# Patient Record
Sex: Female | Born: 1950 | Race: White | Hispanic: No | Smoking: Never smoker
Health system: Southern US, Community
[De-identification: ages and names within clinical notes are randomized; demographics above are authoritative.]

## PROBLEM LIST (undated history)

## (undated) DIAGNOSIS — H269 Unspecified cataract: Secondary | ICD-10-CM

## (undated) DIAGNOSIS — M2540 Effusion, unspecified joint: Secondary | ICD-10-CM

## (undated) DIAGNOSIS — R112 Nausea with vomiting, unspecified: Secondary | ICD-10-CM

## (undated) DIAGNOSIS — R42 Dizziness and giddiness: Secondary | ICD-10-CM

## (undated) DIAGNOSIS — E119 Type 2 diabetes mellitus without complications: Secondary | ICD-10-CM

## (undated) DIAGNOSIS — F419 Anxiety disorder, unspecified: Secondary | ICD-10-CM

## (undated) DIAGNOSIS — Z9889 Other specified postprocedural states: Secondary | ICD-10-CM

## (undated) DIAGNOSIS — R609 Edema, unspecified: Secondary | ICD-10-CM

## (undated) DIAGNOSIS — E063 Autoimmune thyroiditis: Secondary | ICD-10-CM

## (undated) DIAGNOSIS — K200 Eosinophilic esophagitis: Secondary | ICD-10-CM

## (undated) DIAGNOSIS — E278 Other specified disorders of adrenal gland: Secondary | ICD-10-CM

## (undated) DIAGNOSIS — D179 Benign lipomatous neoplasm, unspecified: Secondary | ICD-10-CM

## (undated) DIAGNOSIS — R0600 Dyspnea, unspecified: Secondary | ICD-10-CM

## (undated) DIAGNOSIS — I10 Essential (primary) hypertension: Secondary | ICD-10-CM

## (undated) DIAGNOSIS — M1990 Unspecified osteoarthritis, unspecified site: Secondary | ICD-10-CM

## (undated) DIAGNOSIS — K219 Gastro-esophageal reflux disease without esophagitis: Secondary | ICD-10-CM

## (undated) DIAGNOSIS — R197 Diarrhea, unspecified: Secondary | ICD-10-CM

## (undated) DIAGNOSIS — D649 Anemia, unspecified: Secondary | ICD-10-CM

## (undated) DIAGNOSIS — M62830 Muscle spasm of back: Secondary | ICD-10-CM

## (undated) DIAGNOSIS — E079 Disorder of thyroid, unspecified: Secondary | ICD-10-CM

## (undated) DIAGNOSIS — J189 Pneumonia, unspecified organism: Secondary | ICD-10-CM

## (undated) DIAGNOSIS — E785 Hyperlipidemia, unspecified: Secondary | ICD-10-CM

## (undated) DIAGNOSIS — R350 Frequency of micturition: Secondary | ICD-10-CM

## (undated) DIAGNOSIS — Z86010 Personal history of colonic polyps: Secondary | ICD-10-CM

## (undated) DIAGNOSIS — F329 Major depressive disorder, single episode, unspecified: Secondary | ICD-10-CM

## (undated) DIAGNOSIS — R51 Headache: Secondary | ICD-10-CM

## (undated) DIAGNOSIS — M2550 Pain in unspecified joint: Secondary | ICD-10-CM

## (undated) DIAGNOSIS — I499 Cardiac arrhythmia, unspecified: Secondary | ICD-10-CM

## (undated) DIAGNOSIS — F32A Depression, unspecified: Secondary | ICD-10-CM

## (undated) HISTORY — PX: OTHER SURGICAL HISTORY: SHX169

## (undated) HISTORY — PX: BIOPSY THYROID: PRO38

## (undated) HISTORY — PX: ESOPHAGOGASTRODUODENOSCOPY: SHX1529

## (undated) HISTORY — PX: VAGINAL HYSTERECTOMY: SUR661

## (undated) HISTORY — DX: Hyperlipidemia, unspecified: E78.5

## (undated) HISTORY — PX: WISDOM TOOTH EXTRACTION: SHX21

## (undated) HISTORY — PX: DENTAL SURGERY: SHX609

## (undated) HISTORY — DX: Gastro-esophageal reflux disease without esophagitis: K21.9

## (undated) HISTORY — PX: COLONOSCOPY: SHX174

## (undated) HISTORY — DX: Disorder of thyroid, unspecified: E07.9

## (undated) HISTORY — PX: CARDIAC CATHETERIZATION: SHX172

## (undated) HISTORY — PX: DIAGNOSTIC LAPAROSCOPY: SUR761

## (undated) HISTORY — DX: Essential (primary) hypertension: I10

## (undated) HISTORY — DX: Anemia, unspecified: D64.9

## (undated) HISTORY — DX: Unspecified osteoarthritis, unspecified site: M19.90

---
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
## 1978-07-21 HISTORY — PX: LAPAROSCOPIC ENDOMETRIOSIS FULGURATION: SUR769

## 1986-07-21 HISTORY — PX: OTHER SURGICAL HISTORY: SHX169

## 1998-07-16 ENCOUNTER — Other Ambulatory Visit: Admission: RE | Admit: 1998-07-16 | Discharge: 1998-07-16 | Payer: Self-pay | Admitting: Obstetrics and Gynecology

## 1999-03-25 ENCOUNTER — Emergency Department (HOSPITAL_COMMUNITY): Admission: EM | Admit: 1999-03-25 | Discharge: 1999-03-25 | Payer: Self-pay | Admitting: Emergency Medicine

## 1999-08-02 ENCOUNTER — Other Ambulatory Visit: Admission: RE | Admit: 1999-08-02 | Discharge: 1999-08-02 | Payer: Self-pay | Admitting: Obstetrics and Gynecology

## 2001-02-22 ENCOUNTER — Other Ambulatory Visit: Admission: RE | Admit: 2001-02-22 | Discharge: 2001-02-22 | Payer: Self-pay | Admitting: *Deleted

## 2001-09-19 ENCOUNTER — Emergency Department (HOSPITAL_COMMUNITY): Admission: EM | Admit: 2001-09-19 | Discharge: 2001-09-19 | Payer: Self-pay | Admitting: Emergency Medicine

## 2002-06-23 ENCOUNTER — Other Ambulatory Visit: Admission: RE | Admit: 2002-06-23 | Discharge: 2002-06-23 | Payer: Self-pay | Admitting: *Deleted

## 2006-12-29 ENCOUNTER — Encounter: Admission: RE | Admit: 2006-12-29 | Discharge: 2006-12-29 | Payer: Self-pay | Admitting: Internal Medicine

## 2007-01-01 ENCOUNTER — Encounter: Admission: RE | Admit: 2007-01-01 | Discharge: 2007-01-01 | Payer: Self-pay | Admitting: Internal Medicine

## 2007-01-26 ENCOUNTER — Encounter: Admission: RE | Admit: 2007-01-26 | Discharge: 2007-01-26 | Payer: Self-pay | Admitting: Internal Medicine

## 2007-01-26 ENCOUNTER — Encounter (INDEPENDENT_AMBULATORY_CARE_PROVIDER_SITE_OTHER): Payer: Self-pay | Admitting: Interventional Radiology

## 2007-01-26 ENCOUNTER — Other Ambulatory Visit: Admission: RE | Admit: 2007-01-26 | Discharge: 2007-01-26 | Payer: Self-pay | Admitting: Interventional Radiology

## 2007-06-01 ENCOUNTER — Ambulatory Visit (HOSPITAL_COMMUNITY): Admission: RE | Admit: 2007-06-01 | Discharge: 2007-06-01 | Payer: Self-pay | Admitting: Urology

## 2008-02-21 ENCOUNTER — Encounter: Admission: RE | Admit: 2008-02-21 | Discharge: 2008-03-14 | Payer: Self-pay | Admitting: Internal Medicine

## 2008-10-19 ENCOUNTER — Encounter: Admission: RE | Admit: 2008-10-19 | Discharge: 2008-10-19 | Payer: Self-pay | Admitting: Internal Medicine

## 2009-06-05 ENCOUNTER — Encounter: Admission: RE | Admit: 2009-06-05 | Discharge: 2009-06-05 | Payer: Self-pay | Admitting: Internal Medicine

## 2010-08-10 ENCOUNTER — Encounter: Payer: Self-pay | Admitting: Internal Medicine

## 2010-09-30 ENCOUNTER — Ambulatory Visit
Admission: RE | Admit: 2010-09-30 | Discharge: 2010-09-30 | Disposition: A | Discharge status: Home / Self Care | Payer: 59 | Source: Ambulatory Visit | Attending: Internal Medicine | Admitting: Internal Medicine

## 2010-09-30 ENCOUNTER — Other Ambulatory Visit: Payer: Self-pay | Admitting: Internal Medicine

## 2010-09-30 DIAGNOSIS — R110 Nausea: Secondary | ICD-10-CM

## 2010-09-30 DIAGNOSIS — R1011 Right upper quadrant pain: Secondary | ICD-10-CM

## 2011-03-12 ENCOUNTER — Other Ambulatory Visit: Payer: Self-pay | Admitting: Internal Medicine

## 2011-03-13 ENCOUNTER — Ambulatory Visit
Admission: RE | Admit: 2011-03-13 | Discharge: 2011-03-13 | Disposition: A | Discharge status: Home / Self Care | Payer: 59 | Source: Ambulatory Visit | Attending: Internal Medicine | Admitting: Internal Medicine

## 2013-08-02 ENCOUNTER — Other Ambulatory Visit: Payer: Self-pay | Admitting: Gastroenterology

## 2013-08-02 DIAGNOSIS — R110 Nausea: Secondary | ICD-10-CM

## 2013-08-17 ENCOUNTER — Ambulatory Visit (HOSPITAL_COMMUNITY): Payer: 59

## 2013-08-26 ENCOUNTER — Encounter (HOSPITAL_COMMUNITY)
Admission: RE | Admit: 2013-08-26 | Discharge: 2013-08-26 | Disposition: A | Discharge status: Home / Self Care | Payer: 59 | Source: Ambulatory Visit | Attending: Gastroenterology | Admitting: Gastroenterology

## 2013-08-26 ENCOUNTER — Ambulatory Visit (HOSPITAL_COMMUNITY)
Admission: RE | Admit: 2013-08-26 | Discharge: 2013-08-26 | Disposition: A | Discharge status: Home / Self Care | Payer: 59 | Source: Ambulatory Visit | Attending: Gastroenterology | Admitting: Gastroenterology

## 2013-08-26 DIAGNOSIS — C787 Secondary malignant neoplasm of liver and intrahepatic bile duct: Secondary | ICD-10-CM | POA: Insufficient documentation

## 2013-08-26 DIAGNOSIS — R110 Nausea: Secondary | ICD-10-CM

## 2013-08-26 DIAGNOSIS — C801 Malignant (primary) neoplasm, unspecified: Secondary | ICD-10-CM | POA: Insufficient documentation

## 2013-08-26 MED ORDER — SINCALIDE 5 MCG IJ SOLR
0.0200 ug/kg | Freq: Once | INTRAMUSCULAR | Status: AC
  Administered 2013-08-26: 1.82 ug via INTRAVENOUS

## 2013-08-26 MED ORDER — TECHNETIUM TC 99M MEBROFENIN IV KIT
5.0000 | PACK | Freq: Once | INTRAVENOUS | Status: AC | PRN
  Administered 2013-08-26: 5 via INTRAVENOUS

## 2013-08-26 MED ORDER — SINCALIDE 5 MCG IJ SOLR
INTRAMUSCULAR | Status: AC
  Administered 2013-08-26: 1.82 ug via INTRAVENOUS
  Filled 2013-08-26: qty 5

## 2013-08-26 MED ORDER — STERILE WATER FOR INJECTION IJ SOLN
INTRAMUSCULAR | Status: AC
  Filled 2013-08-26: qty 10

## 2013-09-13 ENCOUNTER — Ambulatory Visit (INDEPENDENT_AMBULATORY_CARE_PROVIDER_SITE_OTHER): Payer: 59 | Admitting: General Surgery

## 2013-10-04 ENCOUNTER — Encounter (INDEPENDENT_AMBULATORY_CARE_PROVIDER_SITE_OTHER): Payer: Self-pay | Admitting: General Surgery

## 2013-10-04 ENCOUNTER — Encounter (INDEPENDENT_AMBULATORY_CARE_PROVIDER_SITE_OTHER): Payer: Self-pay

## 2013-10-04 ENCOUNTER — Ambulatory Visit (INDEPENDENT_AMBULATORY_CARE_PROVIDER_SITE_OTHER): Payer: 59 | Admitting: General Surgery

## 2013-10-04 VITALS — BP 130/74 | HR 78 | Temp 97.7°F | Ht 62.0 in | Wt 199.0 lb

## 2013-10-04 DIAGNOSIS — K828 Other specified diseases of gallbladder: Secondary | ICD-10-CM

## 2013-10-04 NOTE — Progress Notes (Signed)
Patient ID: Sarah Fuentes, female   DOB: March 22, 1951, 63 y.o.   MRN: [id number]  Chief Complaint  Patient presents with  . Abdominal Pain    new pt    HPI Sarah Fuentes is a 63 y.o. female.  Here for evaluation of biliary dyskinesia.  HPI The patient has had 2 years of abdominal discomfort. A lot of times as follows eating fatty foods and very spicy foods. She had an upper GI endoscopy and a colonoscopy which were negative. An ultrasound of the abdomen demonstrated no evidence of gallstones or acute cholecystitis. The patient had a HIDA scan which demonstrated an ejection fraction of less than 9%. A surgical consultation was obtained. Past Medical History  Diagnosis Date  . Anemia   . Arthritis   . GERD (gastroesophageal reflux disease)   . Hyperlipidemia   . Hypertension   . Chronic kidney disease   . Thyroid disease     Past Surgical History  Procedure Laterality Date  . Tonsillectomy and adenoidectomy  1955  . Abdominal hysterectomy    . Laparoscopic endometriosis fulguration  1980    Family History  Problem Relation Age of Onset  . Cancer Mother   . Kidney disease Father     Social History History  Substance Use Topics  . Smoking status: Never Smoker   . Smokeless tobacco: Not on file  . Alcohol Use: No    Allergies  Allergen Reactions  . Iohexol      Code: HIVES, Desc: Pt states years ago she had several IVP's and became allergic by breaking out in hives.  She since has had benadryl prior to "new" contrast has  done ok.     Current Outpatient Prescriptions  Medication Sig Dispense Refill  . ALPRAZolam (XANAX) 0.25 MG tablet Take 0.25 mg by mouth at bedtime as needed for anxiety.      Marland Kitchen BYSTOLIC 10 MG tablet       . Dexlansoprazole (DEXILANT PO) Take 60 mg by mouth.      Cristy Friedlander HFA 110 MCG/ACT inhaler       . furosemide (LASIX) 40 MG tablet       . valsartan (DIOVAN) 320 MG tablet        No current facility-administered medications for this visit.     Review of Systems Review of Systems  Constitutional: Positive for appetite change.  HENT: Negative.   Respiratory: Negative.   Cardiovascular: Negative for chest pain.  Gastrointestinal: Positive for nausea and abdominal pain.  Endocrine: Negative.   Genitourinary: Negative.   Allergic/Immunologic: Negative.     Blood pressure 130/74, pulse 78, temperature 97.7 F (36.5 C), temperature source Oral, height 5\' 2"  (1.575 m), weight 199 lb (90.266 kg).  Physical Exam Physical Exam  Constitutional: She is oriented to person, place, and time. She appears well-developed and well-nourished.  HENT:  Head: Normocephalic and atraumatic.  Eyes: Conjunctivae and EOM are normal. Pupils are equal, round, and reactive to light.  Neck: Normal range of motion. Neck supple.  Cardiovascular: Normal rate, regular rhythm and normal heart sounds.   Pulmonary/Chest: Effort normal and breath sounds normal.  Abdominal: Soft. Normal appearance and bowel sounds are normal. There is tenderness (mild) in the right upper quadrant and epigastric area.  Genitourinary: Vagina normal and uterus normal.  Musculoskeletal: Normal range of motion.  Neurological: She is alert and oriented to person, place, and time. She has normal reflexes.  Skin: Skin is warm and dry.  Psychiatric: She has  a normal mood and affect. Her behavior is normal. Judgment and thought content normal.    Data Reviewed I have reviewed the study sent over by her primary care physician and her gastroenterologist.  Assessment     The diagnosis of biliary dyskinesia is supported.  Symptomatic biliary dyskinesia however the patient did not respond to the cholecystokinin injection as you normally expect. Based on her history of postprandial pain especially with fatty foods I suspect the patient does have symptoms related to her biliary dyskinesia.     Plan    Laparoscopic cholecystectomy. The patient has multiple allergies. One of which  is likely iodine. I would not perform a cholangiogram for this patient.        Gwenyth Ober 10/04/2013, 10:05 AM

## 2013-10-14 ENCOUNTER — Encounter (INDEPENDENT_AMBULATORY_CARE_PROVIDER_SITE_OTHER): Payer: Self-pay

## 2013-10-17 ENCOUNTER — Encounter (HOSPITAL_COMMUNITY): Payer: Self-pay

## 2013-10-24 NOTE — Pre-Procedure Instructions (Signed)
MEYGAN KYSER  10/24/2013   Your procedure is scheduled on:  Tues, April 14 @ 7:15 AM  Report to [facility]  at 5:30 AM.  Call this number if you have problems the morning of surgery: [phone]   Remember:   Do not eat food or drink liquids after midnight.   Take these medicines the morning of surgery with A SIP OF WATER: Xanax(Alprazolam),Bystolic,Dexilant(Dexlansoprazole),and Flovent<Bring Your Inhaler With You>              Stop taking your Ibuprofen.No Goody's,BC's,Aleve,Aspirin,Fish Oil,or any Herbal Medications   Do not wear jewelry, make-up or nail polish.  Do not wear lotions, powders, or perfumes. You may wear deodorant.  Do not shave 48 hours prior to surgery.   Do not bring valuables to the hospital.  [facility] is not responsible                  for any belongings or valuables.               Contacts, dentures or bridgework may not be worn into surgery.  Leave suitcase in the car. After surgery it may be brought to your room.  For patients admitted to the hospital, discharge time is determined by your                treatment team.               Patients discharged the day of surgery will not be allowed to drive  home.    Special Instructions:  [place] - Preparing for Surgery  Before surgery, you can play an important role.  Because skin is not sterile, your skin needs to be as free of germs as possible.  You can reduce the number of germs on you skin by washing with CHG (chlorahexidine gluconate) soap before surgery.  CHG is an antiseptic cleaner which kills germs and bonds with the skin to continue killing germs even after washing.  Please DO NOT use if you have an allergy to CHG or antibacterial soaps.  If your skin becomes reddened/irritated stop using the CHG and inform your nurse when you arrive at Short Stay.  Do not shave (including legs and underarms) for at least 48 hours prior to the first CHG shower.  You may shave your face.  Please follow  these instructions carefully:   1.  Shower with CHG Soap the night before surgery and the                                morning of Surgery.  2.  If you choose to wash your hair, wash your hair first as usual with your       normal shampoo.  3.  After you shampoo, rinse your hair and body thoroughly to remove the                      Shampoo.  4.  Use CHG as you would any other liquid soap.  You can apply chg directly       to the skin and wash gently with scrungie or a clean washcloth.  5.  Apply the CHG Soap to your body ONLY FROM THE NECK DOWN.        Do not use on open wounds or open sores.  Avoid contact with your eyes,  ears, mouth and genitals (private parts).  Wash genitals (private parts)       with your normal soap.  6.  Wash thoroughly, paying special attention to the area where your surgery        will be performed.  7.  Thoroughly rinse your body with warm water from the neck down.  8.  DO NOT shower/wash with your normal soap after using and rinsing off       the CHG Soap.  9.  Pat yourself dry with a clean towel.            10.  Wear clean pajamas.            11.  Place clean sheets on your bed the night of your first shower and do not        sleep with pets.  Day of Surgery  Do not apply any lotions/deoderants the morning of surgery.  Please wear clean clothes to the hospital/surgery center.     Please read over the following fact sheets that you were given: Pain Booklet, Coughing and Deep Breathing and Surgical Site Infection Prevention

## 2013-10-25 ENCOUNTER — Encounter (HOSPITAL_COMMUNITY)
Admission: RE | Admit: 2013-10-25 | Discharge: 2013-10-25 | Disposition: A | Discharge status: Home / Self Care | Payer: 59 | Source: Ambulatory Visit | Attending: General Surgery | Admitting: General Surgery

## 2013-10-25 ENCOUNTER — Encounter (HOSPITAL_COMMUNITY): Payer: Self-pay

## 2013-10-25 DIAGNOSIS — Z01812 Encounter for preprocedural laboratory examination: Secondary | ICD-10-CM | POA: Insufficient documentation

## 2013-10-25 DIAGNOSIS — Z01810 Encounter for preprocedural cardiovascular examination: Secondary | ICD-10-CM | POA: Insufficient documentation

## 2013-10-25 DIAGNOSIS — Z01818 Encounter for other preprocedural examination: Secondary | ICD-10-CM | POA: Insufficient documentation

## 2013-10-25 HISTORY — DX: Benign lipomatous neoplasm, unspecified: D17.9

## 2013-10-25 HISTORY — DX: Cardiac arrhythmia, unspecified: I49.9

## 2013-10-25 HISTORY — DX: Depression, unspecified: F32.A

## 2013-10-25 HISTORY — DX: Effusion, unspecified joint: M25.40

## 2013-10-25 HISTORY — DX: Other specified disorders of adrenal gland: E27.8

## 2013-10-25 HISTORY — DX: Dizziness and giddiness: R42

## 2013-10-25 HISTORY — DX: Diarrhea, unspecified: R19.7

## 2013-10-25 HISTORY — DX: Frequency of micturition: R35.0

## 2013-10-25 HISTORY — DX: Eosinophilic esophagitis: K20.0

## 2013-10-25 HISTORY — DX: Type 2 diabetes mellitus without complications: E11.9

## 2013-10-25 HISTORY — DX: Edema, unspecified: R60.9

## 2013-10-25 HISTORY — DX: Nausea with vomiting, unspecified: R11.2

## 2013-10-25 HISTORY — DX: Anxiety disorder, unspecified: F41.9

## 2013-10-25 HISTORY — DX: Pain in unspecified joint: M25.50

## 2013-10-25 HISTORY — DX: Major depressive disorder, single episode, unspecified: F32.9

## 2013-10-25 HISTORY — DX: Autoimmune thyroiditis: E06.3

## 2013-10-25 HISTORY — DX: Unspecified cataract: H26.9

## 2013-10-25 HISTORY — DX: Other specified postprocedural states: Z98.890

## 2013-10-25 HISTORY — DX: Personal history of colonic polyps: Z86.010

## 2013-10-25 HISTORY — DX: Muscle spasm of back: M62.830

## 2013-10-25 HISTORY — DX: Headache: R51

## 2013-10-25 LAB — CBC WITH DIFFERENTIAL/PLATELET
BASOS PCT: 0 % (ref 0–1)
Basophils Absolute: 0 10*3/uL (ref 0.0–0.1)
Eosinophils Absolute: 0.3 10*3/uL (ref 0.0–0.7)
Eosinophils Relative: 2 % (ref 0–5)
HCT: 37.9 % (ref 36.0–46.0)
HEMOGLOBIN: 12.7 g/dL (ref 12.0–15.0)
LYMPHS PCT: 28 % (ref 12–46)
Lymphs Abs: 2.9 10*3/uL (ref 0.7–4.0)
MCH: 29.9 pg (ref 26.0–34.0)
MCHC: 33.5 g/dL (ref 30.0–36.0)
MCV: 89.2 fL (ref 78.0–100.0)
MONOS PCT: 3 % (ref 3–12)
Monocytes Absolute: 0.3 10*3/uL (ref 0.1–1.0)
NEUTROS PCT: 67 % (ref 43–77)
Neutro Abs: 6.8 10*3/uL (ref 1.7–7.7)
Platelets: 310 10*3/uL (ref 150–400)
RBC: 4.25 MIL/uL (ref 3.87–5.11)
RDW: 13.4 % (ref 11.5–15.5)
WBC: 10.4 10*3/uL (ref 4.0–10.5)

## 2013-10-25 LAB — COMPREHENSIVE METABOLIC PANEL
ALBUMIN: 3.8 g/dL (ref 3.5–5.2)
ALT: 19 U/L (ref 0–35)
AST: 17 U/L (ref 0–37)
Alkaline Phosphatase: 86 U/L (ref 39–117)
BILIRUBIN TOTAL: 0.2 mg/dL — AB (ref 0.3–1.2)
BUN: 17 mg/dL (ref 6–23)
CHLORIDE: 102 meq/L (ref 96–112)
CO2: 26 mEq/L (ref 19–32)
Calcium: 9.7 mg/dL (ref 8.4–10.5)
Creatinine, Ser: 0.54 mg/dL (ref 0.50–1.10)
GFR calc Af Amer: >90 mL/min (ref >90)
GFR calc non Af Amer: >90 mL/min (ref >90)
Glucose, Bld: 125 mg/dL — ABNORMAL HIGH (ref 70–99)
Potassium: 4.2 mEq/L (ref 3.7–5.3)
Sodium: 142 mEq/L (ref 137–147)
Total Protein: 7.3 g/dL (ref 6.0–8.3)

## 2013-10-25 NOTE — Progress Notes (Addendum)
  Pt doesn't have a cardiologist  Stress test done > 28yrs ago  Echo done > 59yrs ago  Heart cath in the late 90's  Denies EKG and CXR in past yr   Medical Md is Dr.James Maudie Mercury

## 2013-10-26 NOTE — Progress Notes (Addendum)
Anesthesia Chart Review:  Patient is a 62 year old female scheduled for laparoscopic cholecystectomy on 4/141/5 by Dr. Hulen Skains.  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 BMI is 39 consistent with obesity. She has multiple allergies as outlined in Epic. PCP is Dr. Jani Gravel.  She does not see a cardiologist.  Stress/Echo were over 5 years ago.  Cardiac cath in the 1990's.  Preoperative CXR and labs noted.    EKG on 10/25/13 showed NSR, left BBB. She reported known history of left BBB.  Records requested from Dr. Maudie Mercury.  I will review once available.  George [facility] Short Stay Center/Anesthesiology Phone [phone] 10/26/2013 1:08 PM  Addendum: 10/27/2013 2:50 PM Records from Dr. Maudie Mercury are still pending.  I called and spoke with patient.  She states a left BBB was found on a pre-operative EKG in the early 80's.  She did not undergo a cardiac work-up then but has since had a cardiac cath by Dr. Glade Lloyd at [facility] ~ 1998 that was reportedly normal.  She also says she had a stress test ~ 3-5 years ago.  I called the former [place] who reported that Dr.  Maudie Mercury had referred her there for a stress test in 2010, but that her chart was now in storage. Patient denies chest pain, SOB, pre-syncope, palpitations, or significant LE edema. She lives on the second floor of a condo and is able to climb stairs and do her own housework without CV symptoms.  In regards to her Hashimoto's thyroiditis, she reports her thyroid hormone levels have been normal, so she is being monitored for now. Will follow-up tomorrow if still no records from Dr. Maudie Mercury.  Addendum: 10/28/2013 12:45 PM Received records from [facility].  EKG on 04/10/10 showed ST @ 101 bpm, LAD, left BBB.   Nuclear stress test on 05/21/09 showed baseline resting ECG left bundle branch block. Normal myocardial perfusion scan demonstrating an attenuation artifact in the anterior region of the myocardium. No ischemia or infarct/scar is seen in the remaining myocardium. This is a low risk scan. Study was technically difficult due to severe breast and diaphragmatic attenuation. Large amount of gut activity present. There is no distinct area of ischemia present.  Echocardiogram on 03/28/08 showed estimated LVEF 55-65%, mild tricuspid insufficiency, mildly elevated pulmonary artery pressure, flat mitral valve closure, nondiagnostic for mitral valve prolapse, mild mitral annular calcification with trace mitral regurgitation.  Patient with previous non-ischemic stress test in 2010.  She had known left BBB at that time.  She is asymptomatic from a CV standpoint and is able to climb stairs up to her condo without CV symptoms.  If no acute changes then I anticipate that she can proceed as planned.  Anesthesiologist Dr. Linna Caprice agrees with this plan.

## 2013-10-31 MED ORDER — CHLORHEXIDINE GLUCONATE 4 % EX LIQD
1.0000 "application " | Freq: Once | CUTANEOUS | Status: DC
  Filled 2013-10-31: qty 15

## 2013-11-01 ENCOUNTER — Encounter (HOSPITAL_COMMUNITY): Payer: Self-pay | Admitting: Anesthesiology

## 2013-11-01 ENCOUNTER — Encounter (HOSPITAL_COMMUNITY)
Admission: RE | Disposition: A | Discharge status: Home / Self Care | Payer: Self-pay | Source: Ambulatory Visit | Attending: General Surgery

## 2013-11-01 ENCOUNTER — Ambulatory Visit (HOSPITAL_COMMUNITY)
Admission: RE | Admit: 2013-11-01 | Discharge: 2013-11-01 | Disposition: A | Discharge status: Home / Self Care | Payer: 59 | Source: Ambulatory Visit | Attending: General Surgery | Admitting: General Surgery

## 2013-11-01 ENCOUNTER — Ambulatory Visit (HOSPITAL_COMMUNITY): Payer: 59 | Admitting: Anesthesiology

## 2013-11-01 ENCOUNTER — Encounter (HOSPITAL_COMMUNITY): Payer: 59 | Admitting: Vascular Surgery

## 2013-11-01 DIAGNOSIS — E785 Hyperlipidemia, unspecified: Secondary | ICD-10-CM | POA: Insufficient documentation

## 2013-11-01 DIAGNOSIS — N189 Chronic kidney disease, unspecified: Secondary | ICD-10-CM | POA: Insufficient documentation

## 2013-11-01 DIAGNOSIS — K811 Chronic cholecystitis: Secondary | ICD-10-CM | POA: Insufficient documentation

## 2013-11-01 DIAGNOSIS — K219 Gastro-esophageal reflux disease without esophagitis: Secondary | ICD-10-CM | POA: Insufficient documentation

## 2013-11-01 DIAGNOSIS — I129 Hypertensive chronic kidney disease with stage 1 through stage 4 chronic kidney disease, or unspecified chronic kidney disease: Secondary | ICD-10-CM | POA: Insufficient documentation

## 2013-11-01 DIAGNOSIS — K828 Other specified diseases of gallbladder: Secondary | ICD-10-CM

## 2013-11-01 HISTORY — PX: CHOLECYSTECTOMY: SHX55

## 2013-11-01 LAB — GLUCOSE, CAPILLARY
GLUCOSE-CAPILLARY: 112 mg/dL — AB (ref 70–99)
Glucose-Capillary: 145 mg/dL — ABNORMAL HIGH (ref 70–99)

## 2013-11-01 SURGERY — LAPAROSCOPIC CHOLECYSTECTOMY
Anesthesia: General | Site: Abdomen

## 2013-11-01 MED ORDER — ROCURONIUM BROMIDE 50 MG/5ML IV SOLN
INTRAVENOUS | Status: AC
  Filled 2013-11-01: qty 1

## 2013-11-01 MED ORDER — BUPIVACAINE-EPINEPHRINE 0.25% -1:200000 IJ SOLN
INTRAMUSCULAR | Status: DC | PRN
  Administered 2013-11-01: 30 mL

## 2013-11-01 MED ORDER — EPHEDRINE SULFATE 50 MG/ML IJ SOLN
INTRAMUSCULAR | Status: AC
  Filled 2013-11-01: qty 1

## 2013-11-01 MED ORDER — HYDROCODONE-ACETAMINOPHEN 5-325 MG PO TABS: 1.0000 | ORAL_TABLET | ORAL | Status: DC | PRN

## 2013-11-01 MED ORDER — METOCLOPRAMIDE HCL 5 MG/ML IJ SOLN
INTRAMUSCULAR | Status: AC
  Filled 2013-11-01: qty 2

## 2013-11-01 MED ORDER — SODIUM CHLORIDE 0.9 % IR SOLN
Status: DC | PRN
  Administered 2013-11-01: 1000 mL

## 2013-11-01 MED ORDER — PROMETHAZINE HCL 12.5 MG PO TABS: 12.5000 mg | ORAL_TABLET | Freq: Four times a day (QID) | ORAL | Status: AC | PRN

## 2013-11-01 MED ORDER — OXYCODONE HCL 5 MG/5ML PO SOLN: 5.0000 mg | Freq: Once | ORAL | Status: DC | PRN

## 2013-11-01 MED ORDER — METOCLOPRAMIDE HCL 5 MG/ML IJ SOLN
10.0000 mg | Freq: Once | INTRAMUSCULAR | Status: AC | PRN
  Administered 2013-11-01: 10 mg via INTRAVENOUS

## 2013-11-01 MED ORDER — SODIUM CHLORIDE 0.9 % IJ SOLN
INTRAMUSCULAR | Status: AC
  Filled 2013-11-01: qty 10

## 2013-11-01 MED ORDER — PROMETHAZINE HCL 25 MG/ML IJ SOLN
INTRAMUSCULAR | Status: AC
  Administered 2013-11-01: 6.25 mg
  Filled 2013-11-01: qty 1

## 2013-11-01 MED ORDER — DEXAMETHASONE SODIUM PHOSPHATE 10 MG/ML IJ SOLN
INTRAMUSCULAR | Status: DC | PRN
  Administered 2013-11-01: 10 mg via INTRAVENOUS

## 2013-11-01 MED ORDER — MIDAZOLAM HCL 5 MG/5ML IJ SOLN
INTRAMUSCULAR | Status: DC | PRN
  Administered 2013-11-01: 2 mg via INTRAVENOUS

## 2013-11-01 MED ORDER — EPHEDRINE SULFATE 50 MG/ML IJ SOLN
INTRAMUSCULAR | Status: DC | PRN
  Administered 2013-11-01: 15 mg via INTRAVENOUS

## 2013-11-01 MED ORDER — ONDANSETRON HCL 4 MG/2ML IJ SOLN
INTRAMUSCULAR | Status: DC | PRN
  Administered 2013-11-01: 4 mg via INTRAVENOUS

## 2013-11-01 MED ORDER — PROPOFOL 10 MG/ML IV BOLUS
INTRAVENOUS | Status: DC | PRN
  Administered 2013-11-01: 200 mg via INTRAVENOUS

## 2013-11-01 MED ORDER — FENTANYL CITRATE 0.05 MG/ML IJ SOLN
INTRAMUSCULAR | Status: AC
  Filled 2013-11-01: qty 5

## 2013-11-01 MED ORDER — HYDROMORPHONE HCL PF 1 MG/ML IJ SOLN
0.2500 mg | INTRAMUSCULAR | Status: DC | PRN
  Administered 2013-11-01 (×4): 0.25 mg via INTRAVENOUS

## 2013-11-01 MED ORDER — CIPROFLOXACIN IN D5W 400 MG/200ML IV SOLN
INTRAVENOUS | Status: AC
  Administered 2013-11-01: 400 mg via INTRAVENOUS
  Filled 2013-11-01: qty 200

## 2013-11-01 MED ORDER — LIDOCAINE HCL (CARDIAC) 20 MG/ML IV SOLN
INTRAVENOUS | Status: AC
  Filled 2013-11-01: qty 5

## 2013-11-01 MED ORDER — METOCLOPRAMIDE HCL 5 MG/ML IJ SOLN
INTRAMUSCULAR | Status: DC | PRN
  Administered 2013-11-01: 10 mg via INTRAVENOUS

## 2013-11-01 MED ORDER — GLYCOPYRROLATE 0.2 MG/ML IJ SOLN
INTRAMUSCULAR | Status: AC
  Filled 2013-11-01: qty 4

## 2013-11-01 MED ORDER — LACTATED RINGERS IV SOLN
INTRAVENOUS | Status: DC | PRN
  Administered 2013-11-01 (×2): via INTRAVENOUS

## 2013-11-01 MED ORDER — DEXAMETHASONE SODIUM PHOSPHATE 10 MG/ML IJ SOLN
INTRAMUSCULAR | Status: AC
  Filled 2013-11-01: qty 1

## 2013-11-01 MED ORDER — FENTANYL CITRATE 0.05 MG/ML IJ SOLN
INTRAMUSCULAR | Status: DC | PRN
  Administered 2013-11-01: 50 ug via INTRAVENOUS
  Administered 2013-11-01: 100 ug via INTRAVENOUS

## 2013-11-01 MED ORDER — ROCURONIUM BROMIDE 100 MG/10ML IV SOLN
INTRAVENOUS | Status: DC | PRN
  Administered 2013-11-01: 40 mg via INTRAVENOUS

## 2013-11-01 MED ORDER — NEOSTIGMINE METHYLSULFATE 1 MG/ML IJ SOLN
INTRAMUSCULAR | Status: DC | PRN
  Administered 2013-11-01: 4 mg via INTRAVENOUS

## 2013-11-01 MED ORDER — NEOSTIGMINE METHYLSULFATE 1 MG/ML IJ SOLN
INTRAMUSCULAR | Status: AC
  Filled 2013-11-01: qty 10

## 2013-11-01 MED ORDER — ARTIFICIAL TEARS OP OINT
TOPICAL_OINTMENT | OPHTHALMIC | Status: DC | PRN
  Administered 2013-11-01: 1 via OPHTHALMIC

## 2013-11-01 MED ORDER — GLYCOPYRROLATE 0.2 MG/ML IJ SOLN
INTRAMUSCULAR | Status: DC | PRN
  Administered 2013-11-01: .8 mg via INTRAVENOUS
  Administered 2013-11-01: .2 mg via INTRAVENOUS

## 2013-11-01 MED ORDER — MIDAZOLAM HCL 2 MG/2ML IJ SOLN
INTRAMUSCULAR | Status: AC
  Filled 2013-11-01: qty 2

## 2013-11-01 MED ORDER — BUPIVACAINE-EPINEPHRINE (PF) 0.25% -1:200000 IJ SOLN
INTRAMUSCULAR | Status: AC
  Filled 2013-11-01: qty 30

## 2013-11-01 MED ORDER — ONDANSETRON HCL 4 MG/2ML IJ SOLN
INTRAMUSCULAR | Status: AC
  Filled 2013-11-01: qty 2

## 2013-11-01 MED ORDER — PROPOFOL 10 MG/ML IV BOLUS
INTRAVENOUS | Status: AC
  Filled 2013-11-01: qty 20

## 2013-11-01 MED ORDER — HYDROMORPHONE HCL PF 1 MG/ML IJ SOLN
INTRAMUSCULAR | Status: AC
  Filled 2013-11-01: qty 1

## 2013-11-01 MED ORDER — CIPROFLOXACIN IN D5W 400 MG/200ML IV SOLN: 400.0000 mg | INTRAVENOUS | Status: DC

## 2013-11-01 MED ORDER — LIDOCAINE HCL (CARDIAC) 20 MG/ML IV SOLN
INTRAVENOUS | Status: DC | PRN
  Administered 2013-11-01: 100 mg via INTRAVENOUS

## 2013-11-01 MED ORDER — OXYCODONE HCL 5 MG PO TABS: 5.0000 mg | ORAL_TABLET | Freq: Once | ORAL | Status: DC | PRN

## 2013-11-01 SURGICAL SUPPLY — 45 items
ADH SKN CLS APL DERMABOND .7 (GAUZE/BANDAGES/DRESSINGS) ×1
APPLIER CLIP 5 13 M/L LIGAMAX5 (MISCELLANEOUS) ×3
APPLIER CLIP ROT 10 11.4 M/L (STAPLE)
APR CLP MED LRG 11.4X10 (STAPLE)
APR CLP MED LRG 5 ANG JAW (MISCELLANEOUS) ×1
BAG SPEC RTRVL LRG 6X4 10 (ENDOMECHANICALS) ×1
BLADE SURG ROTATE 9660 (MISCELLANEOUS) IMPLANT
CANISTER SUCTION 2500CC (MISCELLANEOUS) ×3 IMPLANT
CHLORAPREP W/TINT 26ML (MISCELLANEOUS) ×3 IMPLANT
CLIP APPLIE 5 13 M/L LIGAMAX5 (MISCELLANEOUS) IMPLANT
CLIP APPLIE ROT 10 11.4 M/L (STAPLE) IMPLANT
COVER MAYO STAND STRL (DRAPES) IMPLANT
COVER SURGICAL LIGHT HANDLE (MISCELLANEOUS) ×3 IMPLANT
DERMABOND ADVANCED (GAUZE/BANDAGES/DRESSINGS) ×2
DERMABOND ADVANCED .7 DNX12 (GAUZE/BANDAGES/DRESSINGS) ×1 IMPLANT
DRAPE C-ARM 42X72 X-RAY (DRAPES) IMPLANT
DRAPE UTILITY 15X26 W/TAPE STR (DRAPE) ×6 IMPLANT
DRSG TEGADERM 2-3/8X2-3/4 SM (GAUZE/BANDAGES/DRESSINGS) ×12 IMPLANT
ELECT REM PT RETURN 9FT ADLT (ELECTROSURGICAL) ×3
ELECTRODE REM PT RTRN 9FT ADLT (ELECTROSURGICAL) ×1 IMPLANT
GLOVE BIO SURGEON STRL SZ7.5 (GLOVE) ×2 IMPLANT
GLOVE BIOGEL PI IND STRL 7.0 (GLOVE) IMPLANT
GLOVE BIOGEL PI IND STRL 8 (GLOVE) ×1 IMPLANT
GLOVE BIOGEL PI INDICATOR 7.0 (GLOVE) ×4
GLOVE BIOGEL PI INDICATOR 8 (GLOVE) ×2
GLOVE ECLIPSE 7.5 STRL STRAW (GLOVE) ×5 IMPLANT
GOWN STRL REUS W/ TWL LRG LVL3 (GOWN DISPOSABLE) ×3 IMPLANT
GOWN STRL REUS W/TWL LRG LVL3 (GOWN DISPOSABLE) ×9
KIT BASIN OR (CUSTOM PROCEDURE TRAY) ×3 IMPLANT
KIT ROOM TURNOVER OR (KITS) ×3 IMPLANT
NS IRRIG 1000ML POUR BTL (IV SOLUTION) ×3 IMPLANT
PAD ARMBOARD 7.5X6 YLW CONV (MISCELLANEOUS) ×3 IMPLANT
POUCH SPECIMEN RETRIEVAL 10MM (ENDOMECHANICALS) ×2 IMPLANT
SCISSORS LAP 5X35 DISP (ENDOMECHANICALS) ×3 IMPLANT
SET CHOLANGIOGRAPH 5 50 .035 (SET/KITS/TRAYS/PACK) IMPLANT
SET IRRIG TUBING LAPAROSCOPIC (IRRIGATION / IRRIGATOR) ×3 IMPLANT
SLEEVE ENDOPATH XCEL 5M (ENDOMECHANICALS) ×5 IMPLANT
SPECIMEN JAR SMALL (MISCELLANEOUS) ×3 IMPLANT
SUT MNCRL AB 4-0 PS2 18 (SUTURE) ×3 IMPLANT
TOWEL OR 17X24 6PK STRL BLUE (TOWEL DISPOSABLE) ×1 IMPLANT
TOWEL OR 17X26 10 PK STRL BLUE (TOWEL DISPOSABLE) ×3 IMPLANT
TRAY LAPAROSCOPIC (CUSTOM PROCEDURE TRAY) ×3 IMPLANT
TROCAR XCEL BLUNT TIP 100MML (ENDOMECHANICALS) ×3 IMPLANT
TROCAR XCEL NON-BLD 11X100MML (ENDOMECHANICALS) IMPLANT
TROCAR XCEL NON-BLD 5MMX100MML (ENDOMECHANICALS) ×3 IMPLANT

## 2013-11-01 NOTE — Anesthesia Postprocedure Evaluation (Signed)
Anesthesia Post Note  Patient: Sarah Fuentes  Procedure(s) Performed: Procedure(s) (LRB): LAPAROSCOPIC CHOLECYSTECTOMY (N/A)  Anesthesia type: General  Patient location: PACU  Post pain: Pain level controlled  Post assessment: Patient's Cardiovascular Status Stable  Last Vitals:  Filed Vitals:   11/01/13 0915  BP: 131/56  Pulse: 88  Temp:   Resp: 13    Post vital signs: Reviewed and stable  Level of consciousness: alert  Complications: No apparent anesthesia complications

## 2013-11-01 NOTE — Interval H&P Note (Signed)
History and Physical Interval Note:  11/01/2013 6:58 AM  Sarah Fuentes  has presented today for surgery, with the diagnosis of Symptomatic biliary dyskinesia  The various methods of treatment have been discussed with the patient and family. After consideration of risks, benefits and other options for treatment, the patient has consented to  Procedure(s): LAPAROSCOPIC CHOLECYSTECTOMY (N/A) as a surgical intervention .  The patient's history has been reviewed, patient examined, no change in status, stable for surgery.  I have reviewed the patient's chart and labs.  Questions were answered to the patient's satisfaction.    Patient has had persistent nausea, but pain is less.  I am hopeful that the surgery will resolve her symptoms.  She wants some Phenergan for nausea postoperatively.   Gwenyth Ober

## 2013-11-01 NOTE — Progress Notes (Signed)
Phenergan 6.25 given per Dr Albertina Parr

## 2013-11-01 NOTE — Anesthesia Preprocedure Evaluation (Addendum)
Anesthesia Evaluation  Patient identified by MRN, date of birth, ID band Patient awake    Reviewed: Allergy & Precautions, H&P , NPO status , Patient's Chart, lab work & pertinent test results, reviewed documented beta blocker date and time   History of Anesthesia Complications (+) PONV and history of anesthetic complications  Airway Mallampati: II TM Distance: >3 FB Neck ROM: full    Dental  (+) Teeth Intact, Dental Advidsory Given, Caps   Pulmonary neg pulmonary ROS,  breath sounds clear to auscultation        Cardiovascular hypertension, negative cardio ROS  + dysrhythmias Rhythm:regular     Neuro/Psych  Headaches, PSYCHIATRIC DISORDERS negative neurological ROS     GI/Hepatic Neg liver ROS, GERD-  Medicated and Controlled,  Endo/Other  diabetesMorbid obesity  Renal/GU negative Renal ROS  negative genitourinary   Musculoskeletal   Abdominal   Peds  Hematology  (+) anemia ,   Anesthesia Other Findings See surgeon's H&P   Reproductive/Obstetrics negative OB ROS                          Anesthesia Physical Anesthesia Plan  ASA: III  Anesthesia Plan: General   Post-op Pain Management:    Induction: Intravenous  Airway Management Planned: Oral ETT  Additional Equipment:   Intra-op Plan:   Post-operative Plan: Extubation in OR  Informed Consent: I have reviewed the patients History and Physical, chart, labs and discussed the procedure including the risks, benefits and alternatives for the proposed anesthesia with the patient or authorized representative who has indicated his/her understanding and acceptance.   Dental Advisory Given  Plan Discussed with: CRNA, Surgeon and Anesthesiologist  Anesthesia Plan Comments:        Anesthesia Quick Evaluation

## 2013-11-01 NOTE — Op Note (Signed)
OPERATIVE REPORT  DATE OF OPERATION: 11/01/2013  PATIENT:  Sarah Fuentes  63 y.o. female  PRE-OPERATIVE DIAGNOSIS:  Symptomatic biliary dyskinesia  POST-OPERATIVE DIAGNOSIS:  Symptomatic biliary dyskinesia  PROCEDURE:  Procedure(s): LAPAROSCOPIC CHOLECYSTECTOMY  SURGEON:  Surgeon(s): Gwenyth Ober, MD  ASSISTANT: None  ANESTHESIA:   general  EBL: <20 ml  BLOOD ADMINISTERED: none  DRAINS: none   SPECIMEN:  Source of Specimen:  Gallbladder  COUNTS CORRECT:  YES  PROCEDURE DETAILS: The patient was taken to the operating room and placed on the table in the supine position.  After an adequate endotracheal anesthetic was administered, the patient was prepped with ChloroPrep, and then draped in the usual manner exposing the entire abdomen laterally, inferiorly and up  to the costal margins.  After a proper timeout was performed including identifying the patient and the procedure to be performed, a supraumbilical 9.3TT midline incision was made using a #15 blade.  This was taken down to the fascia which was then incised with a #15 blade.  The edges of the fascia were tented up with Kocher clamps as the preperitoneal space was penetrated with a Kelly clamp into the peritoneum.  Once this was done, a pursestring suture of 0 Vicryl was passed around the fascial opening.  This was subsequently used to secure the [facility] cannula which was passed into the peritoneal cavity.  Once the [facility] cannula was in place, carbon dioxide gas was insufflated into the peritoneal cavity up to a maximal intra-abdominal pressure of 51mm Hg.The laparoscope, with attached camera and light source, was passed into the peritoneal cavity to visualize the direct insertion of two right upper quadrant 59mm cannulas, and a sup-xiphoid 10mm cannula.  Once all cannulas were in place, the dissection was begun.  Two ratcheted graspers were attached to the dome and infundibulum of the gallbladder and retracted towards the  anterior abdominal wall and the right upper quadrant.  Using cautery attached to a dissecting forceps, the peritoneum overlaying the triangle of Chalot and the hepatoduodenal triangle was dissected away exposing the cystic duct and the cystic artery.  A clip was placed on the gallbladder side of the cystic duct, then the distal cystic duct was clipped multiple times then transected.  The gallbladder was then dissected out of the hepatic bed without event.  It was retrieved from the abdomen using an EndoCatch bag without event.  Once the gallbladder was removed, the bed was inspected for hemostasis.  Once excellent hemostasis was obtained all gas and fluids were aspirated from above the liver, then the cannulas were removed.  The supraumbilical incision was closed using the pursestring suture which was in place.  0.25% bupivicaine with epinephrine was injected at all sites.  All 26mm or greater cannula sites were close using a running subcuticular stitch of 4-0 Monocryl.  5.61mm cannula sites were closed with Dermabond only.Steri-Strips and Tagaderm were used to complete the dressings at all sites.  At this point all needle, sponge, and instrument counts were correct.The patient was awakened from anesthesia and taken to the PACU in stable condition.  PATIENT DISPOSITION:  PACU - hemodynamically stable.   Gwenyth Ober 4/14/20158:34 AM

## 2013-11-01 NOTE — Transfer of Care (Signed)
Immediate Anesthesia Transfer of Care Note  Patient: Sarah Fuentes  Procedure(s) Performed: Procedure(s): LAPAROSCOPIC CHOLECYSTECTOMY (N/A)  Patient Location: PACU  Anesthesia Type:General  Level of Consciousness: awake, alert  and oriented  Airway & Oxygen Therapy: Patient Spontanous Breathing and Patient connected to nasal cannula oxygen  Post-op Assessment: Report given to PACU RN, Post -op Vital signs reviewed and stable and Patient moving all extremities X 4  Post vital signs: Reviewed and stable  Complications: No apparent anesthesia complications

## 2013-11-01 NOTE — Anesthesia Procedure Notes (Signed)
Procedure Name: Intubation Date/Time: 11/01/2013 7:22 AM Performed by: Neldon Newport Pre-anesthesia Checklist: Patient identified, Timeout performed, Emergency Drugs available, Suction available and Patient being monitored Patient Re-evaluated:Patient Re-evaluated prior to inductionOxygen Delivery Method: Circle system utilized Preoxygenation: Pre-oxygenation with 100% oxygen Intubation Type: IV induction Ventilation: Mask ventilation without difficulty Laryngoscope Size: Mac and 3 Grade View: Grade III Tube type: Oral Tube size: 7.5 mm Number of attempts: 1 Placement Confirmation: positive ETCO2 and breath sounds checked- equal and bilateral Secured at: 21 cm Tube secured with: Tape Dental Injury: Teeth and Oropharynx as per pre-operative assessment

## 2013-11-01 NOTE — Discharge Instructions (Addendum)
Laparoscopic Cholecystectomy, Care After Refer to this sheet in the next few weeks. These instructions provide you with information on caring for yourself after your procedure. Your health care provider may also give you more specific instructions. Your treatment has been planned according to current medical practices, but problems sometimes occur. Call your health care provider if you have any problems or questions after your procedure. WHAT TO EXPECT AFTER THE PROCEDURE After your procedure, it is typical to have the following:  Pain at your incision sites. You will be given pain medicines to control the pain.  Mild nausea or vomiting. This should improve after the first 24 hours.  Bloating and possibly shoulder pain from the gas used during the procedure. This will improve after the first 24 hours. HOME CARE INSTRUCTIONS   Change bandages (dressings) as directed by your health care provider.  Keep the wound dry and clean. You may wash the wound gently with soap and water. Gently blot or dab the area dry.  Do not take baths or use swimming pools or hot tubs for 2 weeks or until your health care provider approves.  Only take over-the-counter or prescription medicines as directed by your health care provider.  Continue your normal diet as directed by your health care provider.  Do not lift anything heavier than 20 pounds (9 kg) until your health care provider approves.  Do not play contact sports for 1 week or until your health care provider approves.  Leave dressings intact until clinic visit SEEK MEDICAL CARE IF:   You have redness, swelling, or increasing pain in the wound.  You notice yellowish-white fluid (pus) coming from the wound.  You have drainage from the wound that lasts longer than 1 day.  You notice a bad smell coming from the wound or dressing.  Your surgical cuts (incisions) break open. SEEK IMMEDIATE MEDICAL CARE IF:   You develop a rash.  You have  difficulty breathing.  You have chest pain.  You have a fever.  You have increasing pain in the shoulders (shoulder strap areas).  You have dizzy episodes or faint while standing.  You have severe abdominal pain.  You feel sick to your stomach (nauseous) or throw up (vomit) and this lasts for more than 1 day. Document Released: 07/07/2005 Document Revised: 04/27/2013 Document Reviewed: 02/16/2013 [facility] Patient Information 2014 [place].

## 2013-11-01 NOTE — Progress Notes (Signed)
Pt. States she has had cipro before and had no problems.

## 2013-11-01 NOTE — H&P (View-Only) (Signed)
Patient ID: Sarah Fuentes, female   DOB: 12/06/1950, 63 y.o.   MRN: [id number]  Chief Complaint  Patient presents with  . Abdominal Pain    new pt    HPI Sarah Fuentes is a 63 y.o. female.  Here for evaluation of biliary dyskinesia.  HPI The patient has had 2 years of abdominal discomfort. A lot of times as follows eating fatty foods and very spicy foods. She had an upper GI endoscopy and a colonoscopy which were negative. An ultrasound of the abdomen demonstrated no evidence of gallstones or acute cholecystitis. The patient had a HIDA scan which demonstrated an ejection fraction of less than 9%. A surgical consultation was obtained. Past Medical History  Diagnosis Date  . Anemia   . Arthritis   . GERD (gastroesophageal reflux disease)   . Hyperlipidemia   . Hypertension   . Chronic kidney disease   . Thyroid disease     Past Surgical History  Procedure Laterality Date  . Tonsillectomy and adenoidectomy  1955  . Abdominal hysterectomy    . Laparoscopic endometriosis fulguration  1980    Family History  Problem Relation Age of Onset  . Cancer Mother   . Kidney disease Father     Social History History  Substance Use Topics  . Smoking status: Never Smoker   . Smokeless tobacco: Not on file  . Alcohol Use: No    Allergies  Allergen Reactions  . Iohexol      Code: HIVES, Desc: Pt states years ago she had several IVP's and became allergic by breaking out in hives.  She since has had benadryl prior to "new" contrast has  done ok.     Current Outpatient Prescriptions  Medication Sig Dispense Refill  . ALPRAZolam (XANAX) 0.25 MG tablet Take 0.25 mg by mouth at bedtime as needed for anxiety.      Marland Kitchen BYSTOLIC 10 MG tablet       . Dexlansoprazole (DEXILANT PO) Take 60 mg by mouth.      Cristy Friedlander HFA 110 MCG/ACT inhaler       . furosemide (LASIX) 40 MG tablet       . valsartan (DIOVAN) 320 MG tablet        No current facility-administered medications for this visit.     Review of Systems Review of Systems  Constitutional: Positive for appetite change.  HENT: Negative.   Respiratory: Negative.   Cardiovascular: Negative for chest pain.  Gastrointestinal: Positive for nausea and abdominal pain.  Endocrine: Negative.   Genitourinary: Negative.   Allergic/Immunologic: Negative.     Blood pressure 130/74, pulse 78, temperature 97.7 F (36.5 C), temperature source Oral, height 5\' 2"  (1.575 m), weight 199 lb (90.266 kg).  Physical Exam Physical Exam  Constitutional: She is oriented to person, place, and time. She appears well-developed and well-nourished.  HENT:  Head: Normocephalic and atraumatic.  Eyes: Conjunctivae and EOM are normal. Pupils are equal, round, and reactive to light.  Neck: Normal range of motion. Neck supple.  Cardiovascular: Normal rate, regular rhythm and normal heart sounds.   Pulmonary/Chest: Effort normal and breath sounds normal.  Abdominal: Soft. Normal appearance and bowel sounds are normal. There is tenderness (mild) in the right upper quadrant and epigastric area.  Genitourinary: Vagina normal and uterus normal.  Musculoskeletal: Normal range of motion.  Neurological: She is alert and oriented to person, place, and time. She has normal reflexes.  Skin: Skin is warm and dry.  Psychiatric: She has  a normal mood and affect. Her behavior is normal. Judgment and thought content normal.    Data Reviewed I have reviewed the study sent over by her primary care physician and her gastroenterologist.  Assessment     The diagnosis of biliary dyskinesia is supported.  Symptomatic biliary dyskinesia however the patient did not respond to the cholecystokinin injection as you normally expect. Based on her history of postprandial pain especially with fatty foods I suspect the patient does have symptoms related to her biliary dyskinesia.     Plan    Laparoscopic cholecystectomy. The patient has multiple allergies. One of which  is likely iodine. I would not perform a cholangiogram for this patient.        Gwenyth Ober 10/04/2013, 10:05 AM

## 2013-11-02 ENCOUNTER — Encounter (HOSPITAL_COMMUNITY): Payer: Self-pay | Admitting: General Surgery

## 2013-11-02 ENCOUNTER — Ambulatory Visit (INDEPENDENT_AMBULATORY_CARE_PROVIDER_SITE_OTHER): Payer: 59 | Admitting: General Surgery

## 2013-11-02 VITALS — BP 150/90 | HR 83 | Temp 97.4°F | Resp 12 | Ht 60.0 in | Wt 204.2 lb

## 2013-11-02 DIAGNOSIS — K828 Other specified diseases of gallbladder: Secondary | ICD-10-CM

## 2013-11-02 NOTE — Assessment & Plan Note (Signed)
No significant wound issues.    Follow up with Dr. Hulen Skains.

## 2013-11-02 NOTE — Patient Instructions (Signed)
Follow up with Dr. Hulen Skains April 28.  Call earlier if concerns develop.

## 2013-11-02 NOTE — Progress Notes (Signed)
HISTORY: Pt is POD 1 lap chole by dr. Hulen Skains.  She was concerned about bleeding at the epigastric incision site.  She has not had fever/chills.  She is doing well overall.      EXAM: General:  Alert and oriented.   Incision:  sml amount dried blood on steri strip.  Tegaderm changed.     PATHOLOGY: Diagnosis Gallbladder - MILD CHRONIC CHOLECYSTITIS. - THERE IS NO EVIDENCE OF MALIGNANCY.    ASSESSMENT AND PLAN:   Biliary dyskinesia No significant wound issues.    Follow up with Dr. Hulen Skains.      Milus Height, MD Surgical Oncology, [facility], Filomena Jungling, MD Jani Gravel, MD

## 2013-11-15 ENCOUNTER — Encounter (INDEPENDENT_AMBULATORY_CARE_PROVIDER_SITE_OTHER): Payer: Self-pay | Admitting: General Surgery

## 2013-11-15 ENCOUNTER — Ambulatory Visit (INDEPENDENT_AMBULATORY_CARE_PROVIDER_SITE_OTHER): Payer: 59 | Admitting: General Surgery

## 2013-11-15 VITALS — BP 154/90 | HR 80 | Temp 98.0°F | Resp 14 | Wt 200.0 lb

## 2013-11-15 DIAGNOSIS — N390 Urinary tract infection, site not specified: Secondary | ICD-10-CM

## 2013-11-15 DIAGNOSIS — Z09 Encounter for follow-up examination after completed treatment for conditions other than malignant neoplasm: Secondary | ICD-10-CM | POA: Insufficient documentation

## 2013-11-15 MED ORDER — CIPROFLOXACIN HCL 500 MG PO TABS: 500.0000 mg | ORAL_TABLET | Freq: Two times a day (BID) | ORAL | Status: AC

## 2013-11-15 NOTE — Progress Notes (Signed)
Subjective:     Patient ID: Sarah Fuentes, female   DOB: [birth year], 63 y.o.   MRN: [id number]  HPI The patient is doing well although she's had some diarrhea postoperatively. Because of that she feels so she's developed a urinary tract infection. She's had some burning with urination and low-grade fever.  Review of Systems Dysuria and low grade fever    Objective:   Physical Exam Her wounds have healed well with no evidence of infection. She has good normal active bowel sounds. The Tegaderms on each of the dressings was removed without any evidence of infection.    Assessment:     Normal postoperative recovery with possible diarrhea related to cholecystectomy.  Probable postoperative urinary tract infection.     Plan:     Will start the patient on ciprofloxacin 500 mg by mouth twice a day and send her for a urinalysis and urine culture. Followup with me is on an as-needed basis.

## 2013-11-16 LAB — URINALYSIS
Bilirubin Urine: NEGATIVE
Glucose, UA: NEGATIVE mg/dL
KETONES UR: NEGATIVE mg/dL
Nitrite: NEGATIVE
PH: 7 (ref 5.0–8.0)
Protein, ur: NEGATIVE mg/dL
Specific Gravity, Urine: <1.005 — ABNORMAL LOW (ref 1.005–1.030)
Urobilinogen, UA: 0.2 mg/dL (ref 0.0–1.0)

## 2013-11-17 ENCOUNTER — Telehealth (INDEPENDENT_AMBULATORY_CARE_PROVIDER_SITE_OTHER): Payer: Self-pay

## 2013-11-17 NOTE — Telephone Encounter (Signed)
Called pt to let her know her UA did show a UTI. Continue ABX for 10 days. If no better call us back.

## 2013-11-18 LAB — URINE CULTURE: Colony Count: >=100000

## 2013-12-06 ENCOUNTER — Telehealth (INDEPENDENT_AMBULATORY_CARE_PROVIDER_SITE_OTHER): Payer: Self-pay

## 2013-12-06 NOTE — Telephone Encounter (Signed)
Patient states she completed her Cipro on 11/28/13 but she continues to have back and abdomen pain, denies urgency and frequency of urination . Dr. Hulen Skains advised for patient to follow up with her PCP because she could have underlying issues. Patient verbalized understanding

## 2014-04-27 DIAGNOSIS — R300 Dysuria: Secondary | ICD-10-CM | POA: Diagnosis not present

## 2014-04-27 DIAGNOSIS — I10 Essential (primary) hypertension: Secondary | ICD-10-CM | POA: Diagnosis not present

## 2014-04-27 DIAGNOSIS — N390 Urinary tract infection, site not specified: Secondary | ICD-10-CM | POA: Diagnosis not present

## 2014-04-27 DIAGNOSIS — R7309 Other abnormal glucose: Secondary | ICD-10-CM | POA: Diagnosis not present

## 2014-05-02 DIAGNOSIS — E780 Pure hypercholesterolemia: Secondary | ICD-10-CM | POA: Diagnosis not present

## 2014-05-02 DIAGNOSIS — I10 Essential (primary) hypertension: Secondary | ICD-10-CM | POA: Diagnosis not present

## 2014-05-02 DIAGNOSIS — R739 Hyperglycemia, unspecified: Secondary | ICD-10-CM | POA: Diagnosis not present

## 2014-08-24 DIAGNOSIS — K219 Gastro-esophageal reflux disease without esophagitis: Secondary | ICD-10-CM | POA: Diagnosis not present

## 2014-08-24 DIAGNOSIS — K200 Eosinophilic esophagitis: Secondary | ICD-10-CM | POA: Diagnosis not present

## 2014-08-24 DIAGNOSIS — K449 Diaphragmatic hernia without obstruction or gangrene: Secondary | ICD-10-CM | POA: Diagnosis not present

## 2014-09-07 DIAGNOSIS — H21233 Degeneration of iris (pigmentary), bilateral: Secondary | ICD-10-CM | POA: Diagnosis not present

## 2014-09-07 DIAGNOSIS — E119 Type 2 diabetes mellitus without complications: Secondary | ICD-10-CM | POA: Diagnosis not present

## 2014-09-07 DIAGNOSIS — H25013 Cortical age-related cataract, bilateral: Secondary | ICD-10-CM | POA: Diagnosis not present

## 2014-09-07 DIAGNOSIS — H04123 Dry eye syndrome of bilateral lacrimal glands: Secondary | ICD-10-CM | POA: Diagnosis not present

## 2014-10-25 DIAGNOSIS — I10 Essential (primary) hypertension: Secondary | ICD-10-CM | POA: Diagnosis not present

## 2014-10-25 DIAGNOSIS — R7309 Other abnormal glucose: Secondary | ICD-10-CM | POA: Diagnosis not present

## 2014-10-30 DIAGNOSIS — E780 Pure hypercholesterolemia: Secondary | ICD-10-CM | POA: Diagnosis not present

## 2014-10-30 DIAGNOSIS — R7309 Other abnormal glucose: Secondary | ICD-10-CM | POA: Diagnosis not present

## 2014-10-30 DIAGNOSIS — K219 Gastro-esophageal reflux disease without esophagitis: Secondary | ICD-10-CM | POA: Diagnosis not present

## 2014-10-30 DIAGNOSIS — I10 Essential (primary) hypertension: Secondary | ICD-10-CM | POA: Diagnosis not present

## 2014-11-21 DIAGNOSIS — E119 Type 2 diabetes mellitus without complications: Secondary | ICD-10-CM | POA: Diagnosis not present

## 2014-11-21 DIAGNOSIS — R5383 Other fatigue: Secondary | ICD-10-CM | POA: Diagnosis not present

## 2014-11-21 DIAGNOSIS — R109 Unspecified abdominal pain: Secondary | ICD-10-CM | POA: Diagnosis not present

## 2014-11-21 DIAGNOSIS — R110 Nausea: Secondary | ICD-10-CM | POA: Diagnosis not present

## 2014-11-28 DIAGNOSIS — E780 Pure hypercholesterolemia: Secondary | ICD-10-CM | POA: Diagnosis not present

## 2014-11-28 DIAGNOSIS — R739 Hyperglycemia, unspecified: Secondary | ICD-10-CM | POA: Diagnosis not present

## 2014-11-28 DIAGNOSIS — I10 Essential (primary) hypertension: Secondary | ICD-10-CM | POA: Diagnosis not present

## 2014-12-28 DIAGNOSIS — K219 Gastro-esophageal reflux disease without esophagitis: Secondary | ICD-10-CM | POA: Diagnosis not present

## 2014-12-28 DIAGNOSIS — R739 Hyperglycemia, unspecified: Secondary | ICD-10-CM | POA: Diagnosis not present

## 2014-12-28 DIAGNOSIS — M79641 Pain in right hand: Secondary | ICD-10-CM | POA: Diagnosis not present

## 2014-12-28 DIAGNOSIS — I10 Essential (primary) hypertension: Secondary | ICD-10-CM | POA: Diagnosis not present

## 2015-01-25 DIAGNOSIS — R739 Hyperglycemia, unspecified: Secondary | ICD-10-CM | POA: Diagnosis not present

## 2015-01-25 DIAGNOSIS — E780 Pure hypercholesterolemia: Secondary | ICD-10-CM | POA: Diagnosis not present

## 2015-01-25 DIAGNOSIS — I10 Essential (primary) hypertension: Secondary | ICD-10-CM | POA: Diagnosis not present

## 2015-02-26 DIAGNOSIS — R5383 Other fatigue: Secondary | ICD-10-CM | POA: Diagnosis not present

## 2015-02-26 DIAGNOSIS — I10 Essential (primary) hypertension: Secondary | ICD-10-CM | POA: Diagnosis not present

## 2015-02-26 DIAGNOSIS — R7309 Other abnormal glucose: Secondary | ICD-10-CM | POA: Diagnosis not present

## 2015-03-01 DIAGNOSIS — E780 Pure hypercholesterolemia: Secondary | ICD-10-CM | POA: Diagnosis not present

## 2015-03-01 DIAGNOSIS — K219 Gastro-esophageal reflux disease without esophagitis: Secondary | ICD-10-CM | POA: Diagnosis not present

## 2015-03-01 DIAGNOSIS — I10 Essential (primary) hypertension: Secondary | ICD-10-CM | POA: Diagnosis not present

## 2015-03-01 DIAGNOSIS — E039 Hypothyroidism, unspecified: Secondary | ICD-10-CM | POA: Diagnosis not present

## 2015-08-28 DIAGNOSIS — K219 Gastro-esophageal reflux disease without esophagitis: Secondary | ICD-10-CM | POA: Diagnosis not present

## 2015-08-28 DIAGNOSIS — K200 Eosinophilic esophagitis: Secondary | ICD-10-CM | POA: Diagnosis not present

## 2015-08-30 DIAGNOSIS — I10 Essential (primary) hypertension: Secondary | ICD-10-CM | POA: Diagnosis not present

## 2015-08-30 DIAGNOSIS — E039 Hypothyroidism, unspecified: Secondary | ICD-10-CM | POA: Diagnosis not present

## 2015-08-30 DIAGNOSIS — R7309 Other abnormal glucose: Secondary | ICD-10-CM | POA: Diagnosis not present

## 2015-09-04 DIAGNOSIS — I10 Essential (primary) hypertension: Secondary | ICD-10-CM | POA: Diagnosis not present

## 2015-09-04 DIAGNOSIS — E7800 Pure hypercholesterolemia, unspecified: Secondary | ICD-10-CM | POA: Diagnosis not present

## 2015-09-04 DIAGNOSIS — R739 Hyperglycemia, unspecified: Secondary | ICD-10-CM | POA: Diagnosis not present

## 2015-09-11 DIAGNOSIS — E119 Type 2 diabetes mellitus without complications: Secondary | ICD-10-CM | POA: Diagnosis not present

## 2015-09-11 DIAGNOSIS — H40012 Open angle with borderline findings, low risk, left eye: Secondary | ICD-10-CM | POA: Diagnosis not present

## 2015-09-11 DIAGNOSIS — H40011 Open angle with borderline findings, low risk, right eye: Secondary | ICD-10-CM | POA: Diagnosis not present

## 2015-09-11 DIAGNOSIS — H25013 Cortical age-related cataract, bilateral: Secondary | ICD-10-CM | POA: Diagnosis not present

## 2015-10-30 DIAGNOSIS — J329 Chronic sinusitis, unspecified: Secondary | ICD-10-CM | POA: Diagnosis not present

## 2015-11-26 DIAGNOSIS — I10 Essential (primary) hypertension: Secondary | ICD-10-CM | POA: Diagnosis not present

## 2015-11-26 DIAGNOSIS — R739 Hyperglycemia, unspecified: Secondary | ICD-10-CM | POA: Diagnosis not present

## 2015-12-03 DIAGNOSIS — M179 Osteoarthritis of knee, unspecified: Secondary | ICD-10-CM | POA: Diagnosis not present

## 2015-12-03 DIAGNOSIS — I10 Essential (primary) hypertension: Secondary | ICD-10-CM | POA: Diagnosis not present

## 2015-12-03 DIAGNOSIS — M25561 Pain in right knee: Secondary | ICD-10-CM | POA: Diagnosis not present

## 2015-12-03 DIAGNOSIS — R739 Hyperglycemia, unspecified: Secondary | ICD-10-CM | POA: Diagnosis not present

## 2015-12-03 DIAGNOSIS — E7800 Pure hypercholesterolemia, unspecified: Secondary | ICD-10-CM | POA: Diagnosis not present

## 2015-12-24 DIAGNOSIS — M79671 Pain in right foot: Secondary | ICD-10-CM | POA: Diagnosis not present

## 2015-12-24 DIAGNOSIS — M7731 Calcaneal spur, right foot: Secondary | ICD-10-CM | POA: Diagnosis not present

## 2016-06-04 DIAGNOSIS — E039 Hypothyroidism, unspecified: Secondary | ICD-10-CM | POA: Diagnosis not present

## 2016-06-04 DIAGNOSIS — I10 Essential (primary) hypertension: Secondary | ICD-10-CM | POA: Diagnosis not present

## 2016-06-04 DIAGNOSIS — Z780 Asymptomatic menopausal state: Secondary | ICD-10-CM | POA: Diagnosis not present

## 2016-06-04 DIAGNOSIS — R739 Hyperglycemia, unspecified: Secondary | ICD-10-CM | POA: Diagnosis not present

## 2016-06-10 DIAGNOSIS — I10 Essential (primary) hypertension: Secondary | ICD-10-CM | POA: Diagnosis not present

## 2016-06-10 DIAGNOSIS — M25561 Pain in right knee: Secondary | ICD-10-CM | POA: Diagnosis not present

## 2016-06-10 DIAGNOSIS — R739 Hyperglycemia, unspecified: Secondary | ICD-10-CM | POA: Diagnosis not present

## 2016-06-10 DIAGNOSIS — Z23 Encounter for immunization: Secondary | ICD-10-CM | POA: Diagnosis not present

## 2016-08-04 ENCOUNTER — Other Ambulatory Visit (HOSPITAL_COMMUNITY): Payer: Self-pay | Admitting: Internal Medicine

## 2016-08-04 ENCOUNTER — Ambulatory Visit (HOSPITAL_COMMUNITY)
Admission: RE | Admit: 2016-08-04 | Discharge: 2016-08-04 | Disposition: A | Discharge status: Home / Self Care | Payer: Medicare Other | Source: Ambulatory Visit | Attending: Internal Medicine | Admitting: Internal Medicine

## 2016-08-04 DIAGNOSIS — R1011 Right upper quadrant pain: Secondary | ICD-10-CM | POA: Diagnosis not present

## 2016-08-04 DIAGNOSIS — G319 Degenerative disease of nervous system, unspecified: Secondary | ICD-10-CM | POA: Insufficient documentation

## 2016-08-04 DIAGNOSIS — R300 Dysuria: Secondary | ICD-10-CM

## 2016-08-04 DIAGNOSIS — R109 Unspecified abdominal pain: Secondary | ICD-10-CM | POA: Diagnosis not present

## 2016-08-08 DIAGNOSIS — M545 Low back pain: Secondary | ICD-10-CM | POA: Diagnosis not present

## 2016-08-08 DIAGNOSIS — M47816 Spondylosis without myelopathy or radiculopathy, lumbar region: Secondary | ICD-10-CM | POA: Diagnosis not present

## 2016-08-11 ENCOUNTER — Other Ambulatory Visit: Payer: Self-pay | Admitting: Internal Medicine

## 2016-08-11 DIAGNOSIS — G8929 Other chronic pain: Secondary | ICD-10-CM

## 2016-08-11 DIAGNOSIS — M545 Low back pain: Principal | ICD-10-CM

## 2016-08-15 ENCOUNTER — Ambulatory Visit
Admission: RE | Admit: 2016-08-15 | Discharge: 2016-08-15 | Disposition: A | Discharge status: Home / Self Care | Payer: Medicare Other | Source: Ambulatory Visit | Attending: Internal Medicine | Admitting: Internal Medicine

## 2016-08-15 DIAGNOSIS — M545 Low back pain: Secondary | ICD-10-CM | POA: Diagnosis not present

## 2016-08-15 DIAGNOSIS — G8929 Other chronic pain: Secondary | ICD-10-CM

## 2016-08-27 DIAGNOSIS — I10 Essential (primary) hypertension: Secondary | ICD-10-CM | POA: Diagnosis not present

## 2016-08-27 DIAGNOSIS — M545 Low back pain: Secondary | ICD-10-CM | POA: Diagnosis not present

## 2016-08-27 DIAGNOSIS — F419 Anxiety disorder, unspecified: Secondary | ICD-10-CM | POA: Diagnosis not present

## 2016-08-27 DIAGNOSIS — Z0000 Encounter for general adult medical examination without abnormal findings: Secondary | ICD-10-CM | POA: Diagnosis not present

## 2016-09-16 DIAGNOSIS — Z6835 Body mass index (BMI) 35.0-35.9, adult: Secondary | ICD-10-CM | POA: Diagnosis not present

## 2016-09-16 DIAGNOSIS — K760 Fatty (change of) liver, not elsewhere classified: Secondary | ICD-10-CM | POA: Diagnosis not present

## 2016-09-16 DIAGNOSIS — K200 Eosinophilic esophagitis: Secondary | ICD-10-CM | POA: Diagnosis not present

## 2016-09-16 DIAGNOSIS — K210 Gastro-esophageal reflux disease with esophagitis: Secondary | ICD-10-CM | POA: Diagnosis not present

## 2016-11-10 DIAGNOSIS — H40013 Open angle with borderline findings, low risk, bilateral: Secondary | ICD-10-CM | POA: Diagnosis not present

## 2016-11-10 DIAGNOSIS — H524 Presbyopia: Secondary | ICD-10-CM | POA: Diagnosis not present

## 2016-11-10 DIAGNOSIS — E119 Type 2 diabetes mellitus without complications: Secondary | ICD-10-CM | POA: Diagnosis not present

## 2016-11-10 DIAGNOSIS — H2513 Age-related nuclear cataract, bilateral: Secondary | ICD-10-CM | POA: Diagnosis not present

## 2016-12-02 DIAGNOSIS — F419 Anxiety disorder, unspecified: Secondary | ICD-10-CM | POA: Diagnosis not present

## 2016-12-02 DIAGNOSIS — R739 Hyperglycemia, unspecified: Secondary | ICD-10-CM | POA: Diagnosis not present

## 2016-12-02 DIAGNOSIS — Z0000 Encounter for general adult medical examination without abnormal findings: Secondary | ICD-10-CM | POA: Diagnosis not present

## 2016-12-02 DIAGNOSIS — Z5181 Encounter for therapeutic drug level monitoring: Secondary | ICD-10-CM | POA: Diagnosis not present

## 2016-12-02 DIAGNOSIS — I10 Essential (primary) hypertension: Secondary | ICD-10-CM | POA: Diagnosis not present

## 2016-12-08 DIAGNOSIS — Z0000 Encounter for general adult medical examination without abnormal findings: Secondary | ICD-10-CM | POA: Diagnosis not present

## 2016-12-08 DIAGNOSIS — R739 Hyperglycemia, unspecified: Secondary | ICD-10-CM | POA: Diagnosis not present

## 2016-12-08 DIAGNOSIS — E7800 Pure hypercholesterolemia, unspecified: Secondary | ICD-10-CM | POA: Diagnosis not present

## 2016-12-08 DIAGNOSIS — I10 Essential (primary) hypertension: Secondary | ICD-10-CM | POA: Diagnosis not present

## 2017-03-09 DIAGNOSIS — J0110 Acute frontal sinusitis, unspecified: Secondary | ICD-10-CM | POA: Diagnosis not present

## 2017-03-09 DIAGNOSIS — R739 Hyperglycemia, unspecified: Secondary | ICD-10-CM | POA: Diagnosis not present

## 2017-03-09 DIAGNOSIS — I10 Essential (primary) hypertension: Secondary | ICD-10-CM | POA: Diagnosis not present

## 2017-03-09 DIAGNOSIS — E7800 Pure hypercholesterolemia, unspecified: Secondary | ICD-10-CM | POA: Diagnosis not present

## 2017-03-11 DIAGNOSIS — R0602 Shortness of breath: Secondary | ICD-10-CM | POA: Diagnosis not present

## 2017-06-01 DIAGNOSIS — R739 Hyperglycemia, unspecified: Secondary | ICD-10-CM | POA: Diagnosis not present

## 2017-06-01 DIAGNOSIS — Z5181 Encounter for therapeutic drug level monitoring: Secondary | ICD-10-CM | POA: Diagnosis not present

## 2017-06-01 DIAGNOSIS — Z79899 Other long term (current) drug therapy: Secondary | ICD-10-CM | POA: Diagnosis not present

## 2017-06-01 DIAGNOSIS — I10 Essential (primary) hypertension: Secondary | ICD-10-CM | POA: Diagnosis not present

## 2017-06-08 DIAGNOSIS — F419 Anxiety disorder, unspecified: Secondary | ICD-10-CM | POA: Diagnosis not present

## 2017-06-08 DIAGNOSIS — I10 Essential (primary) hypertension: Secondary | ICD-10-CM | POA: Diagnosis not present

## 2017-06-08 DIAGNOSIS — E7800 Pure hypercholesterolemia, unspecified: Secondary | ICD-10-CM | POA: Diagnosis not present

## 2017-06-08 DIAGNOSIS — E119 Type 2 diabetes mellitus without complications: Secondary | ICD-10-CM | POA: Diagnosis not present

## 2017-06-08 DIAGNOSIS — Z79899 Other long term (current) drug therapy: Secondary | ICD-10-CM | POA: Diagnosis not present

## 2017-09-22 DIAGNOSIS — K219 Gastro-esophageal reflux disease without esophagitis: Secondary | ICD-10-CM | POA: Diagnosis not present

## 2017-09-22 DIAGNOSIS — R194 Change in bowel habit: Secondary | ICD-10-CM | POA: Diagnosis not present

## 2017-09-22 DIAGNOSIS — K200 Eosinophilic esophagitis: Secondary | ICD-10-CM | POA: Diagnosis not present

## 2017-11-16 DIAGNOSIS — H40013 Open angle with borderline findings, low risk, bilateral: Secondary | ICD-10-CM | POA: Diagnosis not present

## 2017-11-16 DIAGNOSIS — E119 Type 2 diabetes mellitus without complications: Secondary | ICD-10-CM | POA: Diagnosis not present

## 2017-11-16 DIAGNOSIS — H25013 Cortical age-related cataract, bilateral: Secondary | ICD-10-CM | POA: Diagnosis not present

## 2017-11-16 DIAGNOSIS — H52202 Unspecified astigmatism, left eye: Secondary | ICD-10-CM | POA: Diagnosis not present

## 2017-11-17 DIAGNOSIS — I10 Essential (primary) hypertension: Secondary | ICD-10-CM | POA: Diagnosis not present

## 2017-11-17 DIAGNOSIS — M545 Low back pain: Secondary | ICD-10-CM | POA: Diagnosis not present

## 2017-11-17 DIAGNOSIS — M25561 Pain in right knee: Secondary | ICD-10-CM | POA: Diagnosis not present

## 2017-11-17 DIAGNOSIS — Z889 Allergy status to unspecified drugs, medicaments and biological substances status: Secondary | ICD-10-CM | POA: Diagnosis not present

## 2017-11-17 DIAGNOSIS — E119 Type 2 diabetes mellitus without complications: Secondary | ICD-10-CM | POA: Diagnosis not present

## 2017-11-17 DIAGNOSIS — E7800 Pure hypercholesterolemia, unspecified: Secondary | ICD-10-CM | POA: Diagnosis not present

## 2017-12-10 DIAGNOSIS — E119 Type 2 diabetes mellitus without complications: Secondary | ICD-10-CM | POA: Diagnosis not present

## 2017-12-10 DIAGNOSIS — Z79899 Other long term (current) drug therapy: Secondary | ICD-10-CM | POA: Diagnosis not present

## 2017-12-10 DIAGNOSIS — Z5181 Encounter for therapeutic drug level monitoring: Secondary | ICD-10-CM | POA: Diagnosis not present

## 2017-12-10 DIAGNOSIS — I10 Essential (primary) hypertension: Secondary | ICD-10-CM | POA: Diagnosis not present

## 2017-12-17 DIAGNOSIS — F419 Anxiety disorder, unspecified: Secondary | ICD-10-CM | POA: Diagnosis not present

## 2017-12-17 DIAGNOSIS — R739 Hyperglycemia, unspecified: Secondary | ICD-10-CM | POA: Diagnosis not present

## 2017-12-17 DIAGNOSIS — E119 Type 2 diabetes mellitus without complications: Secondary | ICD-10-CM | POA: Diagnosis not present

## 2017-12-17 DIAGNOSIS — I10 Essential (primary) hypertension: Secondary | ICD-10-CM | POA: Diagnosis not present

## 2017-12-17 DIAGNOSIS — Z79899 Other long term (current) drug therapy: Secondary | ICD-10-CM | POA: Diagnosis not present

## 2018-03-01 DIAGNOSIS — N390 Urinary tract infection, site not specified: Secondary | ICD-10-CM | POA: Diagnosis not present

## 2018-06-02 DIAGNOSIS — Z23 Encounter for immunization: Secondary | ICD-10-CM | POA: Diagnosis not present

## 2018-06-20 ENCOUNTER — Emergency Department (HOSPITAL_COMMUNITY)
Admission: EM | Admit: 2018-06-20 | Discharge: 2018-06-20 | Disposition: A | Discharge status: Home / Self Care | Payer: Medicare Other | Attending: Emergency Medicine | Admitting: Emergency Medicine

## 2018-06-20 ENCOUNTER — Emergency Department (HOSPITAL_COMMUNITY): Payer: Medicare Other

## 2018-06-20 ENCOUNTER — Encounter (HOSPITAL_COMMUNITY): Payer: Self-pay

## 2018-06-20 DIAGNOSIS — R1030 Lower abdominal pain, unspecified: Secondary | ICD-10-CM | POA: Diagnosis not present

## 2018-06-20 DIAGNOSIS — E063 Autoimmune thyroiditis: Secondary | ICD-10-CM | POA: Insufficient documentation

## 2018-06-20 DIAGNOSIS — R0600 Dyspnea, unspecified: Secondary | ICD-10-CM | POA: Diagnosis not present

## 2018-06-20 DIAGNOSIS — R1031 Right lower quadrant pain: Secondary | ICD-10-CM | POA: Diagnosis present

## 2018-06-20 DIAGNOSIS — A09 Infectious gastroenteritis and colitis, unspecified: Secondary | ICD-10-CM | POA: Diagnosis not present

## 2018-06-20 DIAGNOSIS — Z79899 Other long term (current) drug therapy: Secondary | ICD-10-CM | POA: Diagnosis not present

## 2018-06-20 DIAGNOSIS — E119 Type 2 diabetes mellitus without complications: Secondary | ICD-10-CM | POA: Insufficient documentation

## 2018-06-20 DIAGNOSIS — K644 Residual hemorrhoidal skin tags: Secondary | ICD-10-CM | POA: Insufficient documentation

## 2018-06-20 DIAGNOSIS — I959 Hypotension, unspecified: Secondary | ICD-10-CM | POA: Diagnosis not present

## 2018-06-20 DIAGNOSIS — I10 Essential (primary) hypertension: Secondary | ICD-10-CM | POA: Diagnosis not present

## 2018-06-20 DIAGNOSIS — R0902 Hypoxemia: Secondary | ICD-10-CM | POA: Diagnosis not present

## 2018-06-20 DIAGNOSIS — R112 Nausea with vomiting, unspecified: Secondary | ICD-10-CM | POA: Diagnosis not present

## 2018-06-20 DIAGNOSIS — D3501 Benign neoplasm of right adrenal gland: Secondary | ICD-10-CM | POA: Diagnosis not present

## 2018-06-20 DIAGNOSIS — D3502 Benign neoplasm of left adrenal gland: Secondary | ICD-10-CM | POA: Diagnosis not present

## 2018-06-20 DIAGNOSIS — R0602 Shortness of breath: Secondary | ICD-10-CM | POA: Diagnosis not present

## 2018-06-20 LAB — CBC WITH DIFFERENTIAL/PLATELET
Abs Immature Granulocytes: 0.07 10*3/uL (ref 0.00–0.07)
BASOS ABS: 0 10*3/uL (ref 0.0–0.1)
Basophils Relative: 0 %
EOS PCT: 0 %
Eosinophils Absolute: 0 10*3/uL (ref 0.0–0.5)
HCT: 42.7 % (ref 36.0–46.0)
Hemoglobin: 13.8 g/dL (ref 12.0–15.0)
Immature Granulocytes: 0 %
Lymphocytes Relative: 2 %
Lymphs Abs: 0.3 10*3/uL — ABNORMAL LOW (ref 0.7–4.0)
MCH: 29.1 pg (ref 26.0–34.0)
MCHC: 32.3 g/dL (ref 30.0–36.0)
MCV: 90.1 fL (ref 80.0–100.0)
Monocytes Absolute: 0.4 10*3/uL (ref 0.1–1.0)
Monocytes Relative: 2 %
NRBC: 0 % (ref 0.0–0.2)
Neutro Abs: 16.3 10*3/uL — ABNORMAL HIGH (ref 1.7–7.7)
Neutrophils Relative %: 96 %
Platelets: 340 10*3/uL (ref 150–400)
RBC: 4.74 MIL/uL (ref 3.87–5.11)
RDW: 12.9 % (ref 11.5–15.5)
WBC: 17.1 10*3/uL — AB (ref 4.0–10.5)

## 2018-06-20 LAB — LIPASE, BLOOD: Lipase: 23 U/L (ref 11–51)

## 2018-06-20 LAB — COMPREHENSIVE METABOLIC PANEL
ALBUMIN: 4.3 g/dL (ref 3.5–5.0)
ALT: 24 U/L (ref 0–44)
ANION GAP: 10 (ref 5–15)
AST: 22 U/L (ref 15–41)
Alkaline Phosphatase: 63 U/L (ref 38–126)
BILIRUBIN TOTAL: 0.5 mg/dL (ref 0.3–1.2)
BUN: 18 mg/dL (ref 8–23)
CO2: 25 mmol/L (ref 22–32)
Calcium: 9.2 mg/dL (ref 8.9–10.3)
Chloride: 105 mmol/L (ref 98–111)
Creatinine, Ser: 0.72 mg/dL (ref 0.44–1.00)
GFR calc non Af Amer: >60 mL/min (ref >60)
Glucose, Bld: 144 mg/dL — ABNORMAL HIGH (ref 70–99)
Potassium: 4.1 mmol/L (ref 3.5–5.1)
Sodium: 140 mmol/L (ref 135–145)
TOTAL PROTEIN: 7.7 g/dL (ref 6.5–8.1)

## 2018-06-20 LAB — URINALYSIS, ROUTINE W REFLEX MICROSCOPIC
Bilirubin Urine: NEGATIVE
Glucose, UA: NEGATIVE mg/dL
KETONES UR: 5 mg/dL — AB
Nitrite: NEGATIVE
Protein, ur: NEGATIVE mg/dL
SPECIFIC GRAVITY, URINE: 1.028 (ref 1.005–1.030)
pH: 5 (ref 5.0–8.0)

## 2018-06-20 LAB — I-STAT CG4 LACTIC ACID, ED
Lactic Acid, Venous: 2.24 mmol/L (ref 0.5–1.9)
Lactic Acid, Venous: 1.16 mmol/L (ref 0.5–1.9)

## 2018-06-20 LAB — POC OCCULT BLOOD, ED: FECAL OCCULT BLD: NEGATIVE

## 2018-06-20 MED ORDER — OXYCODONE-ACETAMINOPHEN 5-325 MG PO TABS: 2.0000 | ORAL_TABLET | Freq: Four times a day (QID) | ORAL | 0 refills | Status: DC | PRN

## 2018-06-20 MED ORDER — ONDANSETRON HCL 4 MG/2ML IJ SOLN
4.0000 mg | Freq: Once | INTRAMUSCULAR | Status: AC
  Administered 2018-06-20: 4 mg via INTRAVENOUS
  Filled 2018-06-20: qty 2

## 2018-06-20 MED ORDER — MORPHINE SULFATE (PF) 4 MG/ML IV SOLN
4.0000 mg | Freq: Once | INTRAVENOUS | Status: AC
  Administered 2018-06-20: 4 mg via INTRAVENOUS
  Filled 2018-06-20: qty 1

## 2018-06-20 MED ORDER — ONDANSETRON HCL 4 MG PO TABS: 4.0000 mg | ORAL_TABLET | Freq: Four times a day (QID) | ORAL | 0 refills | Status: DC

## 2018-06-20 MED ORDER — SODIUM CHLORIDE 0.9 % IV BOLUS
1000.0000 mL | Freq: Once | INTRAVENOUS | Status: AC
  Administered 2018-06-20: 1000 mL via INTRAVENOUS

## 2018-06-20 MED ORDER — DIPHENHYDRAMINE HCL 25 MG PO CAPS: 50.0000 mg | ORAL_CAPSULE | Freq: Once | ORAL | Status: DC

## 2018-06-20 MED ORDER — HYDROCORTISONE NA SUCCINATE PF 250 MG IJ SOLR
200.0000 mg | Freq: Once | INTRAMUSCULAR | Status: DC
  Filled 2018-06-20: qty 200

## 2018-06-20 MED ORDER — SODIUM CHLORIDE 0.9 % IV SOLN
1.0000 g | INTRAVENOUS | Status: AC
  Administered 2018-06-20: 1 g via INTRAVENOUS
  Filled 2018-06-20: qty 1

## 2018-06-20 MED ORDER — OXYCODONE-ACETAMINOPHEN 5-325 MG PO TABS
1.0000 | ORAL_TABLET | Freq: Once | ORAL | Status: AC
  Administered 2018-06-20: 1 via ORAL
  Filled 2018-06-20: qty 1

## 2018-06-20 MED ORDER — ACETAMINOPHEN 500 MG PO TABS
1000.0000 mg | ORAL_TABLET | Freq: Once | ORAL | Status: AC
  Administered 2018-06-20: 1000 mg via ORAL
  Filled 2018-06-20: qty 2

## 2018-06-20 MED ORDER — DIPHENHYDRAMINE HCL 50 MG/ML IJ SOLN
50.0000 mg | Freq: Once | INTRAMUSCULAR | Status: DC
  Filled 2018-06-20: qty 1

## 2018-06-20 MED ORDER — CIPROFLOXACIN HCL 500 MG PO TABS: 500.0000 mg | ORAL_TABLET | Freq: Two times a day (BID) | ORAL | 0 refills | Status: AC

## 2018-06-20 NOTE — ED Notes (Signed)
Bed: WA03 Expected date:  Expected time:  Means of arrival:  Comments: Hold for [facility]

## 2018-06-20 NOTE — ED Provider Notes (Signed)
[facility] DEPT Provider Note   CSN: [id number] Arrival date & time: 06/20/18  1447     History   Chief Complaint Chief Complaint  Patient presents with  . Abdominal Pain    HPI Sarah Fuentes is a 67 y.o. female with a h/o of DM, Hashimoto's disease, depression, hyper lipidemia, hypertension, anxiety, GERD who presents to the emergency department with a chief complaint of abdominal pain.  The patient felt generally unwell with nausea, subjective fever, and chills since she awoke this morning.  She states that she was feeling well yesterday.  She checked her temperature at home and it was 99.2 orally.  She reports that she was able to eat breakfast, but then had 3 episodes of diarrhea, followed by worsening nausea, RLQ abdominal pain, and 7-8 episodes of nonbloody, nonbilious emesis and retching. She then developed pain in her right low back.  She has been unable to keep anything down, including sips of water, since she ate breakfast this morning.  She reports that the right lower quadrant and back pain have both been constant since onset.  She reports the pain is currently at an 8/10, but was 10 /10 prior to arrival. She characterizes the pain as "spasming".  Although she feels the pain is worse in her right lower quadrant, she reports more diffuse pain throughout the lower abdomen.  She states the pain in her right back is more localized.  She reports that she feels as if her abdomen is more bloated and distended. No treatment prior to arrival.  No known aggravating or alleviating factors.  She is states that she also noticed some bright red blood in the toilet bowl for several of the episodes of diarrhea in addition to bright red blood on the toilet tissue during her most recent episode.  She denies melena, dysuria, urinary frequency or hesitancy, constipation, vaginal pain, itching, or discharge, or rash.   She is also been feeling more short of breath since  this morning, but denies chest pain.  She feels generally weak. She has been having a nonproductive cough, but thinks it may be related to her history of esophagitis.  Denies other URI symptoms.  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  Last colonoscopy was ~7 years ago. No history of small bowel obstruction or nephrolithiasis.  No known sick contacts.  The history is provided by the patient. No language interpreter was used.    Past Medical History:  Diagnosis Date  . Anemia   . Anxiety    takes Xanax daily as needed  . Arthritis   . Back spasm    takes Flexeril daily as needed  . Cataract    left eye and immature  . Depression    but doesn't take any meds  . Diabetes mellitus without complication (HCC)    borderline but no meds required   . Diarrhea    takes Pepto every 6hrs as needed  . Dysrhythmia    left bundle branch block-this goes all the way back to the early 80's  . Eosinophilic esophagitis   . 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 GERD (gastroesophageal reflux disease)    takes Dexilant daily  . Hashimoto's disease   . Headache(784.0)   . History of colon polyps   . Hyperlipidemia  but not on any meds  . Hypertension    takes Bystolic and Diovan daily  . Joint pain   . Joint swelling   . Other specified disorders of adrenal glands    small lesions  on adrenal gland  . Peripheral edema    takes Lasix daily  . PONV (postoperative nausea and vomiting)   . Thyroid disease   . Urinary frequency   . Vertigo    takes Meclizine daily   Patient Active Problem List   Diagnosis Date Noted  . Postop check 11/15/2013  . Biliary dyskinesia 10/04/2013    Past Surgical History:  Procedure Laterality Date  . BIOPSY THYROID    . CARDIAC CATHETERIZATION  late 90's  .  CHOLECYSTECTOMY N/A 11/01/2013   Procedure: LAPAROSCOPIC CHOLECYSTECTOMY;  Surgeon: Gwenyth Ober, MD;  Location: [facility];  Service: General;  Laterality: N/A;  . COLONOSCOPY    . cyst drained from leg Left   . DENTAL SURGERY     numerous times  . DIAGNOSTIC LAPAROSCOPY     d/t endometriosis;this was done multiple times  . ESOPHAGOGASTRODUODENOSCOPY    . LAPAROSCOPIC ENDOMETRIOSIS FULGURATION  1980  . reconstruction done to right ureter    . right ureter ruptured  1988   stent was placed   . TONSILLECTOMY AND ADENOIDECTOMY  1955  . ureter stent removed    . VAGINAL HYSTERECTOMY       OB History   None      Home Medications    Prior to Admission medications   Medication Sig Start Date End Date Taking? Authorizing Provider  loperamide (IMODIUM A-D) 2 MG tablet Take 2-3 mg by mouth 4 (four) times daily as needed for diarrhea or loose stools.   Yes [provider]  ALPRAZolam (XANAX) 0.25 MG tablet Take 0.25 mg by mouth 2 (two) times daily as needed for anxiety.     [provider]  bismuth subsalicylate (PEPTO BISMOL) 262 MG/15ML suspension Take 30 mLs by mouth every 6 (six) hours as needed for diarrhea or loose stools.    [provider]  BYSTOLIC 10 MG tablet Take 10 mg by mouth 2 (two) times daily.  09/12/13   [provider]  ciprofloxacin (CIPRO) 500 MG tablet Take 1 tablet (500 mg total) by mouth every 12 (twelve) hours for 7 days. 06/20/18 06/27/18  McDonald, Mia A, PA-C  cyclobenzaprine (FLEXERIL) 10 MG tablet Take 10 mg by mouth 3 (three) times daily as needed for muscle spasms.    [provider]  dexlansoprazole (DEXILANT) 60 MG capsule Take 60 mg by mouth daily.    [provider]  diphenhydrAMINE (BENADRYL) 25 MG tablet Take 25 mg by mouth every 6 (six) hours as needed for itching or allergies.    [provider]  FLOVENT HFA 110 MCG/ACT inhaler Inhale 2 puffs into the lungs daily as needed (shortness of breath).   09/12/13   [provider]  furosemide (LASIX) 40 MG tablet Take 40 mg by mouth daily.  09/12/13   [provider]  ibuprofen (ADVIL,MOTRIN) 200 MG tablet Take 200 mg by mouth every 6 (six) hours as needed (knee pain).    [provider]  meclizine (ANTIVERT) 25 MG tablet Take 25 mg by mouth 3 (three) times daily as needed for dizziness.    [provider]  ondansetron (ZOFRAN) 4 MG tablet Take 1 tablet (4 mg total) by mouth every 6 (six) hours. 06/20/18   McDonald, Mia A,  PA-C  oxyCODONE-acetaminophen (PERCOCET/ROXICET) 5-325 MG tablet Take 2 tablets by mouth every 6 (six) hours as needed for severe pain. 06/20/18   McDonald, Mia A, PA-C  promethazine (PHENERGAN) 12.5 MG tablet Take 1 tablet (12.5 mg total) by mouth every 6 (six) hours as needed for nausea or vomiting. 11/01/13   Judeth Horn, MD  ranitidine (ZANTAC) 150 MG tablet Take 150 mg by mouth 2 (two) times daily as needed for heartburn.    [provider]  valsartan (DIOVAN) 320 MG tablet Take 320 mg by mouth daily.  09/12/13   [provider]    Family History Family History  Problem Relation Age of Onset  . Cancer Mother   . Kidney disease Father     Social History Social History   Tobacco Use  . Smoking status: Never Smoker  Substance Use Topics  . Alcohol use: No  . Drug use: No     Allergies   Iodine; Keflex [cephalexin]; Penicillins; Raspberry; Strawberry extract; Sulfa antibiotics; Amlodipine; Atenolol; Iohexol; Levaquin [levofloxacin in d5w]; Sulfites; Tape; and Toprol xl [metoprolol tartrate]   Review of Systems Review of Systems  Constitutional: Positive for chills and fever. Negative for activity change.  HENT: Negative for congestion and sore throat.   Eyes: Negative for visual disturbance.  Respiratory: Positive for cough and shortness of breath. Negative for wheezing.   Cardiovascular: Negative for chest pain.  Gastrointestinal: Positive for abdominal  distention, abdominal pain, blood in stool, diarrhea, nausea and vomiting. Negative for constipation.  Genitourinary: Negative for dysuria, flank pain, genital sores, hematuria, urgency, vaginal bleeding, vaginal discharge and vaginal pain.  Musculoskeletal: Negative for back pain, myalgias, neck pain and neck stiffness.  Skin: Negative for rash and wound.  Allergic/Immunologic: Negative for immunocompromised state.  Neurological: Positive for weakness (generalized). Negative for dizziness, syncope and headaches.  Psychiatric/Behavioral: Negative for confusion.     Physical Exam Updated Vital Signs BP (!) 109/52   Pulse 63   Temp 98.6 F (37 C) (Oral)   Resp 16   Ht 5' (1.524 m)   Wt 90.7 kg   SpO2 97%   BMI 39.06 kg/m   Physical Exam  Constitutional: No distress.  HENT:  Head: Normocephalic.  Eyes: Conjunctivae are normal.  Neck: Neck supple.  Cardiovascular: Normal rate, regular rhythm, normal heart sounds and intact distal pulses. Exam reveals no gallop and no friction rub.  No murmur heard. Pulmonary/Chest: Effort normal. No respiratory distress.  Crackles in the bilateral bases.  Lungs are otherwise clear to auscultation bilaterally.  Abdominal: Soft. She exhibits no distension and no mass. There is tenderness. There is no rebound and no guarding. No hernia.  She is diffusely tender to palpation throughout the abdomen, but more focally tender to palpation in the lower abdomen.  She also reports palpation in the right lower quadrant causes pain in the left lower quadrant.  Abdomen is soft, nondistended.  Tender to palpation over the right CVA.  No left CVA tenderness.  No peritoneal signs.  Hypoactive bowel sounds in all 4 quadrants.  Genitourinary:  Genitourinary Comments: Chaperoned exam.  2 nonthrombosed external hemorrhoids are present.  Tender to palpation of the internal anus.  No obvious masses.  Minimal to no stool collected with DRE.  No impaction.  Musculoskeletal:  She exhibits no edema, tenderness or deformity.  Neurological: She is alert.  Skin: Skin is warm. No rash noted. No erythema. No pallor.  Psychiatric: Her behavior is normal.  Nursing note and vitals reviewed.  ED Treatments / Results  Labs (all labs ordered are listed, but only abnormal results are displayed) Labs Reviewed  CBC WITH DIFFERENTIAL/PLATELET - Abnormal; Notable for the following components:      Result Value   WBC 17.1 (*)    Neutro Abs 16.3 (*)    Lymphs Abs 0.3 (*)    All other components within normal limits  COMPREHENSIVE METABOLIC PANEL - Abnormal; Notable for the following components:   Glucose, Bld 144 (*)    All other components within normal limits  URINALYSIS, ROUTINE W REFLEX MICROSCOPIC - Abnormal; Notable for the following components:   APPearance HAZY (*)    Hgb urine dipstick SMALL (*)    Ketones, ur 5 (*)    Leukocytes, UA TRACE (*)    Bacteria, UA RARE (*)    All other components within normal limits  I-STAT CG4 LACTIC ACID, ED - Abnormal; Notable for the following components:   Lactic Acid, Venous 2.24 (*)    All other components within normal limits  CULTURE, BLOOD (ROUTINE X 2)  CULTURE, BLOOD (ROUTINE X 2)  URINE CULTURE  LIPASE, BLOOD  POC OCCULT BLOOD, ED  I-STAT CG4 LACTIC ACID, ED    EKG EKG Interpretation  Date/Time:  Sunday June 20 2018 18:05:13 EST Ventricular Rate:  91 PR Interval:    QRS Duration: 138 QT Interval:  423 QTC Calculation: 521 R Axis:   -38 Text Interpretation:  Sinus rhythm Left bundle branch block Confirmed by Gerlene Fee [phone]) on 06/20/2018 6:58:00 PM   Radiology Ct Abdomen Pelvis Wo Contrast  Result Date: 06/20/2018 CLINICAL DATA:  Nausea, vomiting, and diarrhea. EXAM: CT ABDOMEN AND PELVIS WITHOUT CONTRAST TECHNIQUE: Multidetector CT imaging of the abdomen and pelvis was performed following the standard protocol without IV contrast. COMPARISON:  October 19, 2008 FINDINGS: Lower chest: No acute  abnormality. Hepatobiliary: No focal liver abnormality is seen. Status post cholecystectomy. No biliary dilatation. Pancreas: Unremarkable. No pancreatic ductal dilatation or surrounding inflammatory changes. Spleen: Normal in size without focal abnormality. Adrenals/Urinary Tract: Bilateral tiny adrenal nodules are stable, consistent with adenomas and unchanged since 2010. The kidneys, ureters, and bladder are unremarkable. Stomach/Bowel: The stomach and small bowel are normal. There is fluid throughout the colon without colonic wall thickening or pericolonic stranding. The visualized appendix is normal. Vascular/Lymphatic: Atherosclerotic changes are seen in the nonaneurysmal aorta. No adenopathy. Reproductive: Status post hysterectomy. No adnexal masses. Other: No abdominal wall hernia or abnormality. No abdominopelvic ascites. Musculoskeletal: No acute or significant osseous findings. IMPRESSION: 1. Fluid throughout the colon consistent with history of diarrhea. No colonic wall thickening or pericolonic stranding to suggest colitis. 2. Atherosclerotic changes in the nonaneurysmal aorta. 3. Bilateral adrenal adenomas, stable since 2010. Electronically Signed   By: Dorise Bullion III M.D   On: 06/20/2018 18:43   Dg Chest 2 View  Result Date: 06/20/2018 CLINICAL DATA:  Abdominal pain, nausea, vomiting, and diarrhea. Difficulty breathing. EXAM: CHEST - 2 VIEW COMPARISON:  March 11, 2017 FINDINGS: Stable cardiomegaly. The hila and mediastinum are unchanged. No pneumothorax. No pulmonary nodules, masses, or focal infiltrates. IMPRESSION: No active cardiopulmonary disease. Electronically Signed   By: Dorise Bullion III M.D   On: 06/20/2018 16:35    Procedures Procedures (including critical care time)  Medications Ordered in ED Medications  sodium chloride 0.9 % bolus 1,000 mL (0 mLs Intravenous Stopped 06/20/18 1648)  ondansetron (ZOFRAN) injection 4 mg (4 mg Intravenous Given 06/20/18 1557)  morphine 4  MG/ML injection 4 mg (4  mg Intravenous Given 06/20/18 1557)  acetaminophen (TYLENOL) tablet 1,000 mg (1,000 mg Oral Given 06/20/18 1646)  meropenem (MERREM) 1 g in sodium chloride 0.9 % 100 mL IVPB (0 g Intravenous Stopped 06/20/18 1946)  oxyCODONE-acetaminophen (PERCOCET/ROXICET) 5-325 MG per tablet 1 tablet (1 tablet Oral Given 06/20/18 2027)     Initial Impression / Assessment and Plan / ED Course  I have reviewed the triage vital signs and the nursing notes.  Pertinent labs & imaging results that were available during my care of the patient were reviewed by me and considered in my medical decision making (see chart for details).  67 year old female with a h/o of DM, Hashimoto's disease, depression, hyper lipidemia, hypertension, anxiety, GERD presenting with lower abdominal pain, right-sided back pain, hematochezia, fever, chills, N/V/D, and shortness of breath, onset this morning.  Patient was discussed and independently evaluated by Dr. Sedonia Small, attending physician.  SaO2 94% on the monitor on room air.  No tachycardia.  Oral temp is 99.3.  She is normotensive.  On exam, she is in no acute distress.  Will check rectal temp, Hemoccult, chest x-ray, labs, urinalysis, and CT abdomen pelvis.  Will give fluids, Zofran, and morphine for pain control and reassess.  Rectal temp 101.5.  Leukocytosis of 17.1 with a left shift.  Initial lactate 2.24. On my initial examination, the patient was minimally tachycardic in the 100s on the cardiac monitor.  Given that there is a suspected source of infection, code sepsis was initiated.  Tylenol given for fever.  Clinical Course as of Jun 21 141  Sun Jun 20, 2018  1757 Spoke with Dr. Polly Cobia., radiology, regarding the patient's history of iodine and iohexol allergy.  She reports she is previously tolerated IVP for urological procedures and [facility] tech for nuc med study.  There are no CT scans with IV contrast noted in epic.  Dr. Polly Cobia recommends premedication prior to  CT abdomen pelvis.  Also discussed with the patient her allergy to medications.  She states she had previously taken Flagyl and had severe nausea and vomiting states she lost 27 pounds because it made her so sick.  She reports she also has an allergy to levofloxacin with shortness of breath and hives.  She has tolerated ciprofloxacin previously without difficulty.  Will have pharmacy tech update and verify the patient's allergy list.  CT abdomen pelvis is pending and contrast allergy premedication order set has been placed.  Spoke with Sharyn Lull, pharmacist, who had initially recommended Flagyl and ciprofloxacin in the setting of hematochezia and fever.  However, based on the patient's allergies, meropenem has been ordered.   [MM]  3976 Discussed with CT Tech, given the patient's history of anaphylactic allergy to iohexol the patient requires a 13-hour premedication time as opposed to 4-hour premedication time.  Will DC CT abdomen pelvis with contrast in order a noncontrasted study.   [MM]    Clinical Course User Index [MM] McDonald, Mia A, PA-C   Labs are otherwise notable for small hemoglobinuria with trace leukocyte esterase.  Urine culture sent.  CMP is otherwise unremarkable.  EKG with left bundle branch block and sinus rhythm, which is unchanged from previous.  Chest x-ray is unremarkable.  CT abdomen pelvis with fluid throughout the colon, consistent with history of diarrhea.  There is no colonic wall thickening or pericolonic ending to suggest colitis.  In the setting of bloody diarrhea, her hemoglobin is stable at 13.8.  Hemoccult was negative; however, this could be due to poor collection as  minimal stool was present on DRE.   Repeat lactate is normal.  Heart rate has steadily down trended into the 60s to 70s.  Fever has resolved with Tylenol.  On reexamination, the patient reports that she is much more comfortable.  She is tolerated several glasses of water and crackers without difficulty.  She  is feeling much improved.  Given the patient's symptoms, we will plan to cover her with ciprofloxacin for possible hemorrhagic E. coli gastroenteritis.  We will send home with Percocet for pain control and Zofran for nausea.  A 31-month prescription history query was performed using the [place] CSRS prior to discharge. I have strongly encouraged her to follow-up with her primary care provider in the next 2 to 3 days for a reevaluation to ensure her symptoms are improving.  I have also discussed that she should have a low threshold for returning to the emergency department if symptoms return or worsen.  She is agreeable with this plan.  Strict return precautions given.  She is hemodynamically stable and in no acute distress.  She is safe for discharge home with outpatient follow-up at this time.   Final Clinical Impressions(s) / ED Diagnoses   Final diagnoses:  Infectious gastroenteritis    ED Discharge Orders         Ordered    ciprofloxacin (CIPRO) 500 MG tablet  Every 12 hours     06/20/18 2113    ondansetron (ZOFRAN) 4 MG tablet  Every 6 hours     06/20/18 2113    oxyCODONE-acetaminophen (PERCOCET/ROXICET) 5-325 MG tablet  Every 6 hours PRN     06/20/18 2113           Joline Maxcy A, PA-C 06/21/18 0142    Maudie Flakes, MD 06/22/18 1058

## 2018-06-20 NOTE — ED Notes (Signed)
Pt cannot use restroom at this time, aware urine specimen is needed.  

## 2018-06-20 NOTE — ED Notes (Signed)
Bed: [facility] Expected date:  Expected time:  Means of arrival:  Comments: 

## 2018-06-20 NOTE — ED Triage Notes (Signed)
Pt presents to ER via EMS from home complaining of abdominal pain, nausea, vomiting, and diarrhea. Pt states she has had 7-8 episodes of vomiting and diarrhea. Pt rates pain 9/10. Pt is alert and oriented, NAD noted. Skin is warm and dry. Respirations are regular even and unlabored.   Past Medical History:  Diagnosis Date  . Anemia   . Anxiety    takes Xanax daily as needed  . Arthritis   . Back spasm    takes Flexeril daily as needed  . Cataract    left eye and immature  . Depression    but doesn't take any meds  . Diabetes mellitus without complication    borderline but no meds required   . Diarrhea    takes Pepto every 6hrs as needed  . Dysrhythmia    left bundle branch block-this goes all the way back to the early 80's  . Eosinophilic esophagitis   . 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 GERD (gastroesophageal reflux disease)    takes Dexilant daily  . Hashimoto's disease   . Headache(784.0)   . History of colon polyps   . Hyperlipidemia    but not on any meds  . Hypertension    takes Bystolic and Diovan daily  . Joint pain   . Joint swelling   . Other specified disorders of adrenal glands    small lesions  on adrenal gland  . Peripheral edema    takes Lasix daily  . PONV (postoperative nausea and vomiting)   . Thyroid disease   . Urinary frequency   . Vertigo    takes Meclizine daily

## 2018-06-20 NOTE — ED Notes (Signed)
Patient transported to CT 

## 2018-06-20 NOTE — Discharge Instructions (Signed)
Thank you for allowing me to care for you today in the Emergency Department.   Take 1 tablet of Zofran every 6 hours as needed for nausea or vomiting.  Take 650 mg of Tylenol every 6 hours for pain or fever.  For severe pain, you can take 1 tablet of Percocet every 6 hours for severe pain.  Do not work or drive while taking this medication because it can make you drowsy.  It is also a narcotic and can be addicting.  Take 1 tablet of ciprofloxacin 2 times daily for the next 7 days starting tomorrow morning.  Please call Dr. Harrietta Guardian office to schedule a follow up appointment for a recheck in 2-3 days.  Return to the emergency department if you develop worsening blood in your diarrhea, high fever despite taking Tylenol, persistent vomiting despite taking Zofran, if you pass out, develop uncontrollable abdominal pain, or other new, concerning symptoms.

## 2018-06-20 NOTE — Progress Notes (Signed)
A consult was received from an ED physician for Meropenem per pharmacy dosing.  The patient's profile has been reviewed for ht/wt/allergies/indication/available labs.   A one time order has been placed for Meropenem 1g IV.  Further antibiotics/pharmacy consults should be ordered by admitting physician if indicated.                       Thank you,  Gretta Arab PharmD, BCPS Pager [phone] 06/20/2018 5:37 PM

## 2018-06-22 LAB — URINE CULTURE: Culture: <10000 — AB

## 2018-06-24 DIAGNOSIS — E119 Type 2 diabetes mellitus without complications: Secondary | ICD-10-CM | POA: Diagnosis not present

## 2018-06-24 DIAGNOSIS — K529 Noninfective gastroenteritis and colitis, unspecified: Secondary | ICD-10-CM | POA: Diagnosis not present

## 2018-06-25 LAB — CULTURE, BLOOD (ROUTINE X 2)
Culture: NO GROWTH
Culture: NO GROWTH
Special Requests: ADEQUATE
Special Requests: ADEQUATE

## 2018-06-28 DIAGNOSIS — E7800 Pure hypercholesterolemia, unspecified: Secondary | ICD-10-CM | POA: Diagnosis not present

## 2018-06-28 DIAGNOSIS — E119 Type 2 diabetes mellitus without complications: Secondary | ICD-10-CM | POA: Diagnosis not present

## 2018-06-28 DIAGNOSIS — I10 Essential (primary) hypertension: Secondary | ICD-10-CM | POA: Diagnosis not present

## 2018-07-05 DIAGNOSIS — K200 Eosinophilic esophagitis: Secondary | ICD-10-CM | POA: Diagnosis not present

## 2018-07-05 DIAGNOSIS — E7800 Pure hypercholesterolemia, unspecified: Secondary | ICD-10-CM | POA: Diagnosis not present

## 2018-07-05 DIAGNOSIS — I10 Essential (primary) hypertension: Secondary | ICD-10-CM | POA: Diagnosis not present

## 2018-07-05 DIAGNOSIS — E119 Type 2 diabetes mellitus without complications: Secondary | ICD-10-CM | POA: Diagnosis not present

## 2018-07-05 DIAGNOSIS — F419 Anxiety disorder, unspecified: Secondary | ICD-10-CM | POA: Diagnosis not present

## 2018-07-05 DIAGNOSIS — R110 Nausea: Secondary | ICD-10-CM | POA: Diagnosis not present

## 2018-07-05 DIAGNOSIS — Z889 Allergy status to unspecified drugs, medicaments and biological substances status: Secondary | ICD-10-CM | POA: Diagnosis not present

## 2018-09-30 DIAGNOSIS — K219 Gastro-esophageal reflux disease without esophagitis: Secondary | ICD-10-CM | POA: Diagnosis not present

## 2018-09-30 DIAGNOSIS — K760 Fatty (change of) liver, not elsewhere classified: Secondary | ICD-10-CM | POA: Diagnosis not present

## 2018-09-30 DIAGNOSIS — K200 Eosinophilic esophagitis: Secondary | ICD-10-CM | POA: Diagnosis not present

## 2018-12-08 DIAGNOSIS — H43812 Vitreous degeneration, left eye: Secondary | ICD-10-CM | POA: Diagnosis not present

## 2018-12-15 DIAGNOSIS — H43812 Vitreous degeneration, left eye: Secondary | ICD-10-CM | POA: Diagnosis not present

## 2018-12-29 DIAGNOSIS — E119 Type 2 diabetes mellitus without complications: Secondary | ICD-10-CM | POA: Diagnosis not present

## 2018-12-29 DIAGNOSIS — I10 Essential (primary) hypertension: Secondary | ICD-10-CM | POA: Diagnosis not present

## 2018-12-29 DIAGNOSIS — E039 Hypothyroidism, unspecified: Secondary | ICD-10-CM | POA: Diagnosis not present

## 2018-12-29 DIAGNOSIS — E7800 Pure hypercholesterolemia, unspecified: Secondary | ICD-10-CM | POA: Diagnosis not present

## 2019-01-12 DIAGNOSIS — E119 Type 2 diabetes mellitus without complications: Secondary | ICD-10-CM | POA: Diagnosis not present

## 2019-01-12 DIAGNOSIS — I10 Essential (primary) hypertension: Secondary | ICD-10-CM | POA: Diagnosis not present

## 2019-01-12 DIAGNOSIS — K200 Eosinophilic esophagitis: Secondary | ICD-10-CM | POA: Diagnosis not present

## 2019-01-12 DIAGNOSIS — F419 Anxiety disorder, unspecified: Secondary | ICD-10-CM | POA: Diagnosis not present

## 2019-01-12 DIAGNOSIS — Z0000 Encounter for general adult medical examination without abnormal findings: Secondary | ICD-10-CM | POA: Diagnosis not present

## 2019-01-12 DIAGNOSIS — E7800 Pure hypercholesterolemia, unspecified: Secondary | ICD-10-CM | POA: Diagnosis not present

## 2019-01-12 DIAGNOSIS — Z7189 Other specified counseling: Secondary | ICD-10-CM | POA: Diagnosis not present

## 2019-02-09 DIAGNOSIS — H52203 Unspecified astigmatism, bilateral: Secondary | ICD-10-CM | POA: Diagnosis not present

## 2019-02-09 DIAGNOSIS — H40023 Open angle with borderline findings, high risk, bilateral: Secondary | ICD-10-CM | POA: Diagnosis not present

## 2019-02-09 DIAGNOSIS — E113292 Type 2 diabetes mellitus with mild nonproliferative diabetic retinopathy without macular edema, left eye: Secondary | ICD-10-CM | POA: Diagnosis not present

## 2019-02-09 DIAGNOSIS — H25013 Cortical age-related cataract, bilateral: Secondary | ICD-10-CM | POA: Diagnosis not present

## 2019-03-22 DIAGNOSIS — M19072 Primary osteoarthritis, left ankle and foot: Secondary | ICD-10-CM | POA: Diagnosis not present

## 2019-03-22 DIAGNOSIS — Z7189 Other specified counseling: Secondary | ICD-10-CM | POA: Diagnosis not present

## 2019-03-22 DIAGNOSIS — M79641 Pain in right hand: Secondary | ICD-10-CM | POA: Diagnosis not present

## 2019-03-22 DIAGNOSIS — M2550 Pain in unspecified joint: Secondary | ICD-10-CM | POA: Diagnosis not present

## 2019-03-22 DIAGNOSIS — M79642 Pain in left hand: Secondary | ICD-10-CM | POA: Diagnosis not present

## 2019-03-22 DIAGNOSIS — M79672 Pain in left foot: Secondary | ICD-10-CM | POA: Diagnosis not present

## 2019-03-22 DIAGNOSIS — Z23 Encounter for immunization: Secondary | ICD-10-CM | POA: Diagnosis not present

## 2019-06-14 DIAGNOSIS — H401122 Primary open-angle glaucoma, left eye, moderate stage: Secondary | ICD-10-CM | POA: Diagnosis not present

## 2019-07-11 DIAGNOSIS — I10 Essential (primary) hypertension: Secondary | ICD-10-CM | POA: Diagnosis not present

## 2019-07-11 DIAGNOSIS — E118 Type 2 diabetes mellitus with unspecified complications: Secondary | ICD-10-CM | POA: Diagnosis not present

## 2019-07-27 ENCOUNTER — Ambulatory Visit: Payer: Medicare Other | Attending: Internal Medicine

## 2019-07-27 DIAGNOSIS — Z20822 Contact with and (suspected) exposure to covid-19: Secondary | ICD-10-CM

## 2019-07-29 LAB — NOVEL CORONAVIRUS, NAA: SARS-CoV-2, NAA: NOT DETECTED

## 2019-08-16 DIAGNOSIS — E7800 Pure hypercholesterolemia, unspecified: Secondary | ICD-10-CM | POA: Diagnosis not present

## 2019-08-16 DIAGNOSIS — F419 Anxiety disorder, unspecified: Secondary | ICD-10-CM | POA: Diagnosis not present

## 2019-08-16 DIAGNOSIS — E119 Type 2 diabetes mellitus without complications: Secondary | ICD-10-CM | POA: Diagnosis not present

## 2019-08-16 DIAGNOSIS — I10 Essential (primary) hypertension: Secondary | ICD-10-CM | POA: Diagnosis not present

## 2019-08-17 DIAGNOSIS — H401122 Primary open-angle glaucoma, left eye, moderate stage: Secondary | ICD-10-CM | POA: Diagnosis not present

## 2019-08-17 DIAGNOSIS — H401111 Primary open-angle glaucoma, right eye, mild stage: Secondary | ICD-10-CM | POA: Diagnosis not present

## 2019-09-15 DIAGNOSIS — Z23 Encounter for immunization: Secondary | ICD-10-CM | POA: Diagnosis not present

## 2019-10-04 DIAGNOSIS — K760 Fatty (change of) liver, not elsewhere classified: Secondary | ICD-10-CM | POA: Diagnosis not present

## 2019-10-04 DIAGNOSIS — K219 Gastro-esophageal reflux disease without esophagitis: Secondary | ICD-10-CM | POA: Diagnosis not present

## 2019-10-04 DIAGNOSIS — K200 Eosinophilic esophagitis: Secondary | ICD-10-CM | POA: Diagnosis not present

## 2019-10-13 DIAGNOSIS — Z23 Encounter for immunization: Secondary | ICD-10-CM | POA: Diagnosis not present

## 2019-12-07 DIAGNOSIS — I10 Essential (primary) hypertension: Secondary | ICD-10-CM | POA: Diagnosis not present

## 2019-12-07 DIAGNOSIS — E119 Type 2 diabetes mellitus without complications: Secondary | ICD-10-CM | POA: Diagnosis not present

## 2019-12-14 DIAGNOSIS — H401122 Primary open-angle glaucoma, left eye, moderate stage: Secondary | ICD-10-CM | POA: Diagnosis not present

## 2019-12-14 DIAGNOSIS — H2513 Age-related nuclear cataract, bilateral: Secondary | ICD-10-CM | POA: Diagnosis not present

## 2019-12-14 DIAGNOSIS — H52203 Unspecified astigmatism, bilateral: Secondary | ICD-10-CM | POA: Diagnosis not present

## 2019-12-14 DIAGNOSIS — H401111 Primary open-angle glaucoma, right eye, mild stage: Secondary | ICD-10-CM | POA: Diagnosis not present

## 2019-12-20 IMAGING — CR DG CHEST 2V
2 series · 2 of 2 positions shown · non-contrast
Comparison: March 11, 2017

CLINICAL DATA: Abdominal pain, nausea, vomiting, and diarrhea.
Difficulty breathing.

EXAM:
CHEST - 2 VIEW

[w chest lat]
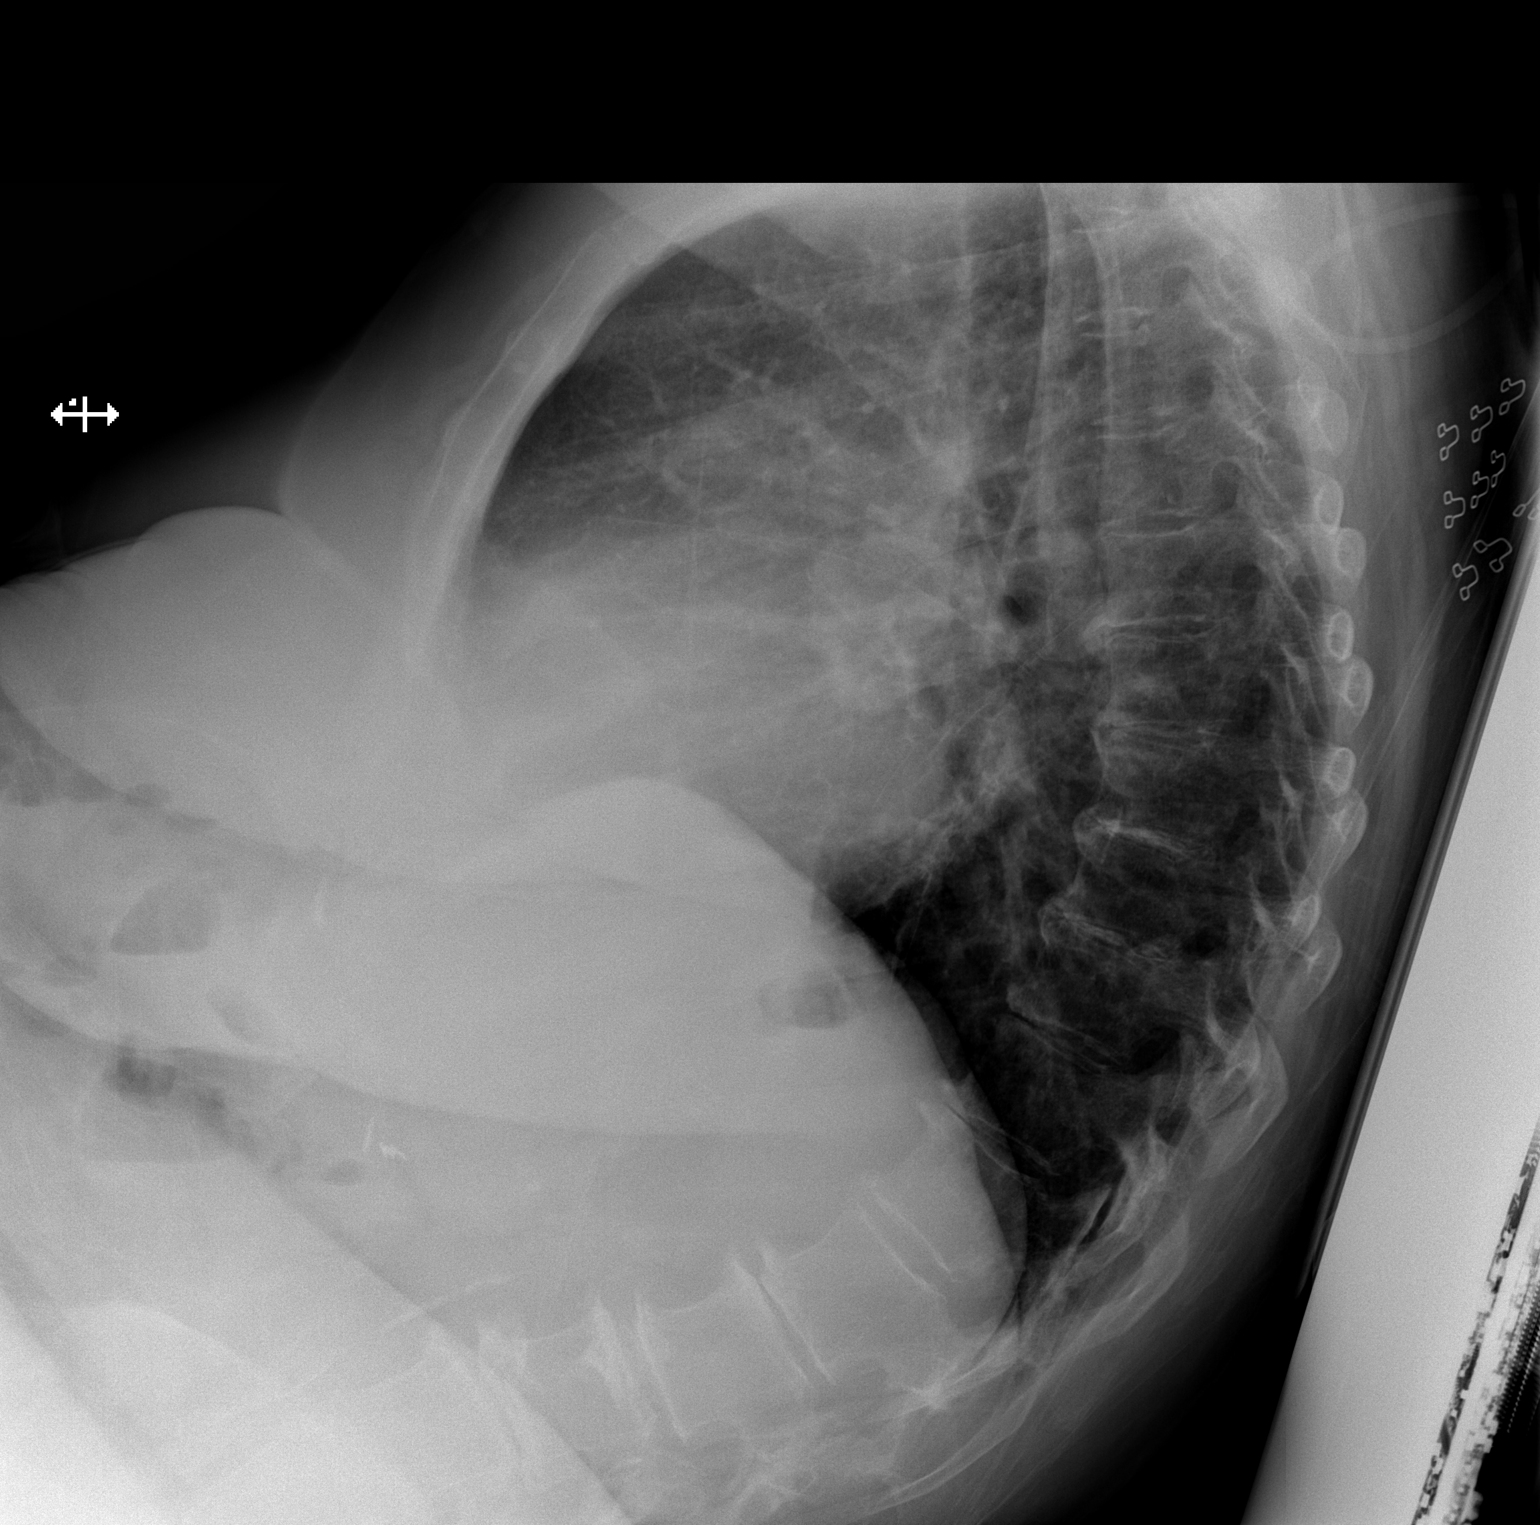

[x chest ap]
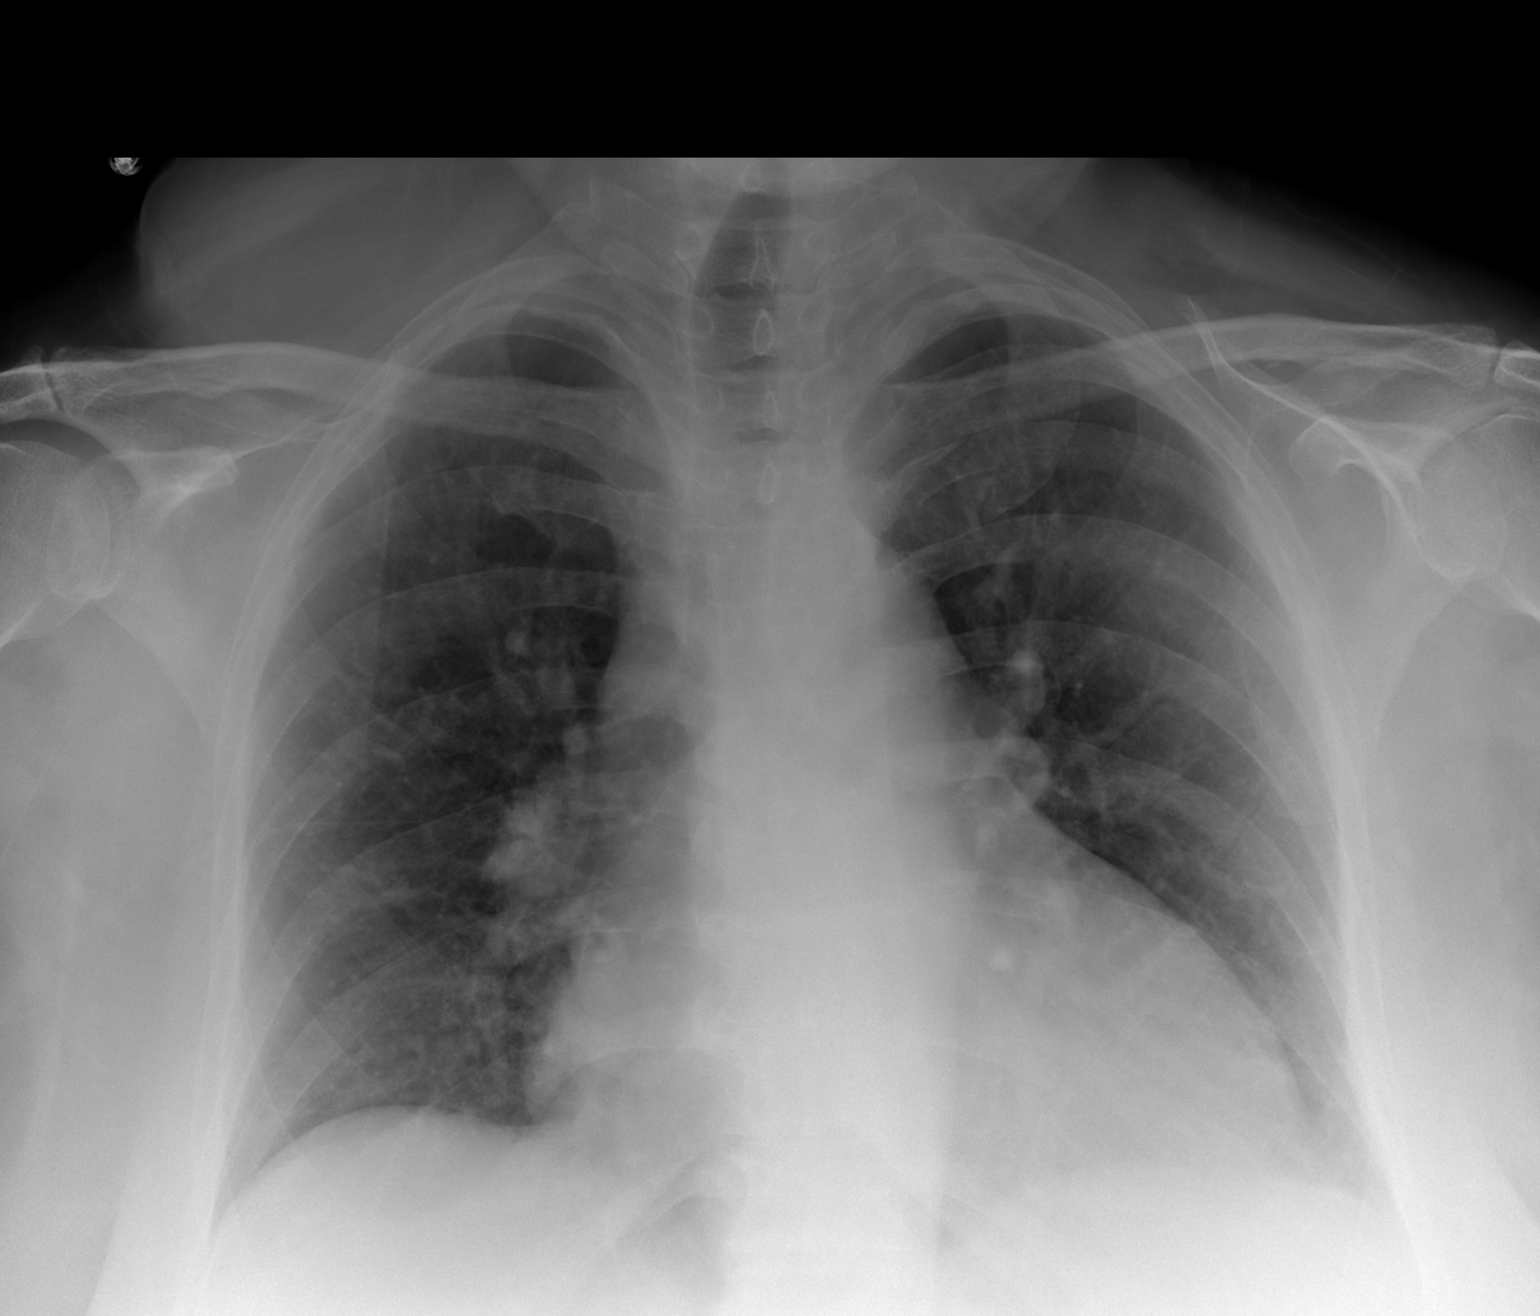

[2 of 2 positions shown; findings below may reference images not displayed]

FINDINGS: Stable cardiomegaly. The hila and mediastinum are unchanged. No
pneumothorax. No pulmonary nodules, masses, or focal infiltrates.
IMPRESSION: No active cardiopulmonary disease.

## 2019-12-23 DIAGNOSIS — K200 Eosinophilic esophagitis: Secondary | ICD-10-CM | POA: Diagnosis not present

## 2019-12-23 DIAGNOSIS — E119 Type 2 diabetes mellitus without complications: Secondary | ICD-10-CM | POA: Diagnosis not present

## 2019-12-23 DIAGNOSIS — E7800 Pure hypercholesterolemia, unspecified: Secondary | ICD-10-CM | POA: Diagnosis not present

## 2019-12-23 DIAGNOSIS — I10 Essential (primary) hypertension: Secondary | ICD-10-CM | POA: Diagnosis not present

## 2020-02-02 DIAGNOSIS — H25812 Combined forms of age-related cataract, left eye: Secondary | ICD-10-CM | POA: Diagnosis not present

## 2020-03-22 DIAGNOSIS — E7800 Pure hypercholesterolemia, unspecified: Secondary | ICD-10-CM | POA: Diagnosis not present

## 2020-03-22 DIAGNOSIS — E039 Hypothyroidism, unspecified: Secondary | ICD-10-CM | POA: Diagnosis not present

## 2020-03-22 DIAGNOSIS — R7309 Other abnormal glucose: Secondary | ICD-10-CM | POA: Diagnosis not present

## 2020-03-22 DIAGNOSIS — I10 Essential (primary) hypertension: Secondary | ICD-10-CM | POA: Diagnosis not present

## 2020-04-02 DIAGNOSIS — Z889 Allergy status to unspecified drugs, medicaments and biological substances status: Secondary | ICD-10-CM | POA: Diagnosis not present

## 2020-04-02 DIAGNOSIS — Z0000 Encounter for general adult medical examination without abnormal findings: Secondary | ICD-10-CM | POA: Diagnosis not present

## 2020-04-02 DIAGNOSIS — M5136 Other intervertebral disc degeneration, lumbar region: Secondary | ICD-10-CM | POA: Diagnosis not present

## 2020-04-02 DIAGNOSIS — K200 Eosinophilic esophagitis: Secondary | ICD-10-CM | POA: Diagnosis not present

## 2020-04-02 DIAGNOSIS — I10 Essential (primary) hypertension: Secondary | ICD-10-CM | POA: Diagnosis not present

## 2020-04-02 DIAGNOSIS — Z23 Encounter for immunization: Secondary | ICD-10-CM | POA: Diagnosis not present

## 2020-04-02 DIAGNOSIS — E7800 Pure hypercholesterolemia, unspecified: Secondary | ICD-10-CM | POA: Diagnosis not present

## 2020-04-02 DIAGNOSIS — E119 Type 2 diabetes mellitus without complications: Secondary | ICD-10-CM | POA: Diagnosis not present

## 2020-04-23 DIAGNOSIS — M25561 Pain in right knee: Secondary | ICD-10-CM | POA: Diagnosis not present

## 2020-04-23 DIAGNOSIS — M2351 Chronic instability of knee, right knee: Secondary | ICD-10-CM | POA: Diagnosis not present

## 2020-04-26 DIAGNOSIS — M25561 Pain in right knee: Secondary | ICD-10-CM | POA: Diagnosis not present

## 2020-06-04 DIAGNOSIS — Z23 Encounter for immunization: Secondary | ICD-10-CM | POA: Diagnosis not present

## 2020-07-04 DIAGNOSIS — H401111 Primary open-angle glaucoma, right eye, mild stage: Secondary | ICD-10-CM | POA: Diagnosis not present

## 2020-07-04 DIAGNOSIS — H401122 Primary open-angle glaucoma, left eye, moderate stage: Secondary | ICD-10-CM | POA: Diagnosis not present

## 2020-10-03 ENCOUNTER — Other Ambulatory Visit: Payer: Self-pay | Admitting: General Surgery

## 2020-10-03 DIAGNOSIS — R221 Localized swelling, mass and lump, neck: Secondary | ICD-10-CM

## 2020-10-04 ENCOUNTER — Other Ambulatory Visit: Payer: Self-pay | Admitting: General Surgery

## 2020-10-04 DIAGNOSIS — R221 Localized swelling, mass and lump, neck: Secondary | ICD-10-CM

## 2020-10-09 ENCOUNTER — Other Ambulatory Visit (HOSPITAL_COMMUNITY): Payer: Self-pay | Admitting: General Surgery

## 2020-10-09 ENCOUNTER — Other Ambulatory Visit: Payer: Self-pay | Admitting: General Surgery

## 2020-10-09 DIAGNOSIS — R221 Localized swelling, mass and lump, neck: Secondary | ICD-10-CM

## 2020-10-09 DIAGNOSIS — R222 Localized swelling, mass and lump, trunk: Secondary | ICD-10-CM

## 2020-10-16 ENCOUNTER — Ambulatory Visit (HOSPITAL_BASED_OUTPATIENT_CLINIC_OR_DEPARTMENT_OTHER)
Admission: RE | Admit: 2020-10-16 | Discharge: 2020-10-16 | Disposition: A | Discharge status: Home / Self Care | Payer: Medicare Other | Source: Ambulatory Visit | Attending: General Surgery | Admitting: General Surgery

## 2020-10-16 ENCOUNTER — Ambulatory Visit (HOSPITAL_BASED_OUTPATIENT_CLINIC_OR_DEPARTMENT_OTHER): Admission: RE | Admit: 2020-10-16 | Payer: Medicare Other | Source: Ambulatory Visit

## 2020-10-16 ENCOUNTER — Other Ambulatory Visit: Payer: Self-pay

## 2020-10-16 DIAGNOSIS — M47812 Spondylosis without myelopathy or radiculopathy, cervical region: Secondary | ICD-10-CM | POA: Insufficient documentation

## 2020-10-16 DIAGNOSIS — I700 Atherosclerosis of aorta: Secondary | ICD-10-CM | POA: Insufficient documentation

## 2020-10-16 DIAGNOSIS — J323 Chronic sphenoidal sinusitis: Secondary | ICD-10-CM | POA: Insufficient documentation

## 2020-10-16 DIAGNOSIS — I517 Cardiomegaly: Secondary | ICD-10-CM | POA: Diagnosis not present

## 2020-10-16 DIAGNOSIS — E042 Nontoxic multinodular goiter: Secondary | ICD-10-CM | POA: Diagnosis not present

## 2020-10-16 DIAGNOSIS — R221 Localized swelling, mass and lump, neck: Secondary | ICD-10-CM | POA: Diagnosis present

## 2020-10-16 DIAGNOSIS — I2510 Atherosclerotic heart disease of native coronary artery without angina pectoris: Secondary | ICD-10-CM | POA: Diagnosis not present

## 2020-10-16 DIAGNOSIS — R222 Localized swelling, mass and lump, trunk: Secondary | ICD-10-CM

## 2020-10-22 ENCOUNTER — Other Ambulatory Visit: Payer: Self-pay | Admitting: General Surgery

## 2020-10-22 DIAGNOSIS — R221 Localized swelling, mass and lump, neck: Secondary | ICD-10-CM

## 2020-10-24 ENCOUNTER — Ambulatory Visit
Admission: RE | Admit: 2020-10-24 | Discharge: 2020-10-24 | Disposition: A | Discharge status: Home / Self Care | Payer: Medicare Other | Source: Ambulatory Visit | Attending: General Surgery | Admitting: General Surgery

## 2020-10-24 DIAGNOSIS — R221 Localized swelling, mass and lump, neck: Secondary | ICD-10-CM

## 2020-11-07 ENCOUNTER — Other Ambulatory Visit: Payer: Self-pay | Admitting: General Surgery

## 2020-11-07 DIAGNOSIS — R221 Localized swelling, mass and lump, neck: Secondary | ICD-10-CM

## 2020-11-07 DIAGNOSIS — E041 Nontoxic single thyroid nodule: Secondary | ICD-10-CM

## 2020-11-14 ENCOUNTER — Ambulatory Visit
Admission: RE | Admit: 2020-11-14 | Discharge: 2020-11-14 | Disposition: A | Discharge status: Home / Self Care | Payer: Medicare Other | Source: Ambulatory Visit | Attending: General Surgery | Admitting: General Surgery

## 2020-11-14 ENCOUNTER — Other Ambulatory Visit (HOSPITAL_COMMUNITY)
Admission: RE | Admit: 2020-11-14 | Discharge: 2020-11-14 | Disposition: A | Discharge status: Home / Self Care | Payer: Medicare Other | Source: Ambulatory Visit | Attending: Student | Admitting: Student

## 2020-11-14 DIAGNOSIS — D440 Neoplasm of uncertain behavior of thyroid gland: Secondary | ICD-10-CM | POA: Diagnosis not present

## 2020-11-14 DIAGNOSIS — E041 Nontoxic single thyroid nodule: Secondary | ICD-10-CM | POA: Diagnosis present

## 2020-11-14 DIAGNOSIS — R221 Localized swelling, mass and lump, neck: Secondary | ICD-10-CM

## 2020-11-15 LAB — CYTOLOGY - NON PAP

## 2020-12-06 ENCOUNTER — Ambulatory Visit: Payer: Self-pay | Admitting: Surgery

## 2020-12-14 ENCOUNTER — Other Ambulatory Visit: Payer: Self-pay | Admitting: Surgery

## 2020-12-18 ENCOUNTER — Other Ambulatory Visit (HOSPITAL_COMMUNITY): Payer: Self-pay | Admitting: Surgery

## 2020-12-18 DIAGNOSIS — R221 Localized swelling, mass and lump, neck: Secondary | ICD-10-CM

## 2020-12-20 NOTE — Patient Instructions (Addendum)
DUE TO COVID-19 ONLY ONE VISITOR IS ALLOWED TO COME WITH YOU AND STAY IN THE WAITING ROOM ONLY DURING PRE OP AND PROCEDURE.   **NO VISITORS ARE ALLOWED IN THE SHORT STAY AREA OR RECOVERY ROOM!!**  IF YOU WILL BE ADMITTED INTO THE HOSPITAL YOU ARE ALLOWED ONLY TWO SUPPORT PEOPLE DURING VISITATION HOURS ONLY (10AM -8PM)   . The support person(s) may change daily. . The support person(s) must pass our screening, gel in and out, and wear a mask at all times, including in the patient's room. . Patients must also wear a mask when staff or their support person are in the room.  No visitors under the age of 77. Any visitor under the age of 44 must be accompanied by an adult.    COVID SWAB TESTING MUST BE COMPLETED ON:  01/01/21 @ 11:00 AM   [address] [place], [place] [address]   . You are not required to quarantine, however you are required to wear a well-fitted mask when you are out and around people not in your household.  [facility] [place] Hygiene often . Do NOT share personal items . Notify your provider if you are in close contact with someone who has COVID or you develop fever 100.4 or greater, new onset of sneezing, cough, sore throat, shortness of breath or body aches  Your procedure is scheduled on: 01/04/21   Report to [facility] Main  Entrance    Report to admitting at 10:30 AM   Call this number if you have problems the morning of surgery [phone]   Do not eat food :After Midnight.   May have liquids until  9:30 AM  day of surgery  CLEAR LIQUID DIET  Foods Allowed                                                                     Foods Excluded  Water, Black Coffee and tea, regular and decaf            liquids that you cannot  Plain Jell-O in any flavor  (No red)                                   see through such as: Fruit ices (not with fruit pulp)                                           milk, soups, orange juice              Iced Popsicles (No red)                                                All solid food                                   Apple juices Sports drinks like Gatorade (No red)  Lightly seasoned clear broth or consume(fat free) Sugar, honey syrup        Oral Hygiene is also important to reduce your risk of infection.                                    Remember - BRUSH YOUR TEETH THE MORNING OF SURGERY WITH YOUR REGULAR TOOTHPASTE   Take these medicines the morning of surgery with A SIP OF WATER: Alprazolam, Bystolic,  Dexlansoprazole, Hydralazine, Promethazine, Pravastatin  DO NOT TAKE ANY ORAL DIABETIC MEDICATIONS DAY OF YOUR SURGERY  How to Manage Your Diabetes Before and After Surgery  Why is it important to control my blood sugar before and after surgery? . Improving blood sugar levels before and after surgery helps healing and can limit problems. . A way of improving blood sugar control is eating a healthy diet by: o  Eating less sugar and carbohydrates o  Increasing activity/exercise o  Talking with your doctor about reaching your blood sugar goals . High blood sugars (greater than 180 mg/dL) can raise your risk of infections and slow your recovery, so you will need to focus on controlling your diabetes during the weeks before surgery. . Make sure that the doctor who takes care of your diabetes knows about your planned surgery including the date and location.  How do I manage my blood sugar before surgery? . Check your blood sugar at least 4 times a day, starting 2 days before surgery, to make sure that the level is not too high or low. o Check your blood sugar the morning of your surgery when you wake up and every 2 hours until you get to the Short Stay unit. . If your blood sugar is less than 70 mg/dL, you will need to treat for low blood sugar: o Do not take insulin. o Treat a low blood sugar (less than 70 mg/dL) with  cup of clear juice (cranberry or apple), 4 glucose tablets, OR glucose gel. o Recheck blood sugar in 15  minutes after treatment (to make sure it is greater than 70 mg/dL). If your blood sugar is not greater than 70 mg/dL on recheck, call [phone] for further instructions. . Report your blood sugar to the short stay nurse when you get to Short Stay.  . If you are admitted to the hospital after surgery: o Your blood sugar will be checked by the staff and you will probably be given insulin after surgery (instead of oral diabetes medicines) to make sure you have good blood sugar levels. o The goal for blood sugar control after surgery is 80-180 mg/dL.   WHAT DO I DO ABOUT MY DIABETES MEDICATION?  Marland Kitchen Do not take oral diabetes medicines (pills) the morning of surgery.  . THE DAY BEFORE SURGERY, take Metformin as prescribed.      . THE MORNING OF SURGERY, do not take Metformin.   Reviewed and Endorsed by [facility] Patient Education Committee, August 2015                              You may not have any metal on your body including hair pins, jewelry, and body piercing             Do not wear make-up, lotions, powders, perfumes, or deodorant  Do not wear nail polish including gel and S&S, artificial/acrylic nails, or  any other type of covering on natural nails including finger and  toenails. If  you have artificial nails, gel coating, etc. that needs to be removed by a nail salon please have this removed prior to surgery or surgery  may need to be  canceled/ delayed if the surgeon/ anesthesia feels like they are unable to be safely monitored.   Do not shave  48 hours prior to surgery.                Do not bring valuables to the hospital. [address].   Bring small overnight bag day of surgery.    Special Instructions: Bring a copy of your healthcare power of attorney and living will documents         the day of surgery if you haven't scanned them  in before.              Please read over the following fact sheets you were given: IF YOU  HAVE QUESTIONS ABOUT YOUR PRE OP INSTRUCTIONS PLEASE CALL  [phone]   [facility] - Preparing for Surgery Before surgery, you can play an important role.  Because skin is not sterile, your skin needs to be as free of germs as possible.  You can reduce the number of germs on your skin by washing with CHG (chlorahexidine gluconate) soap before surgery.  CHG is an antiseptic cleaner which kills germs and bonds with the skin to continue killing germs even after washing. Please DO NOT use if you have an allergy to CHG or antibacterial soaps.  If your skin becomes reddened/irritated stop using the CHG and inform your nurse when you arrive at Short Stay. Do not shave (including legs and underarms) for at least 48 hours prior to the first CHG shower.  You may shave your face/neck.  Please follow these instructions carefully:  1.  Shower with CHG Soap the night before surgery and the  morning of surgery.  2.  If you choose to wash your hair, wash your hair first as usual with your normal  shampoo.  3.  After you shampoo, rinse your hair and body thoroughly to remove the shampoo.                             4.  Use CHG as you would any other liquid soap.  You can apply chg directly to the skin and wash.  Gently with a scrungie or clean washcloth.  5.  Apply the CHG Soap to your body ONLY FROM THE NECK DOWN.   Do   not use on face/ open                           Wound or open sores. Avoid contact with eyes, ears mouth and   genitals (private parts).                       Wash face,  Genitals (private parts) with your normal soap.             6.  Wash thoroughly, paying special attention to the area where your    surgery  will be performed.  7.  Thoroughly rinse your body with warm water from the neck down.  8.  DO NOT shower/wash  with your normal soap after using and rinsing off the CHG Soap.                9.  Pat yourself dry with a clean towel.            10.  Wear clean pajamas.            11.   Place clean sheets on your bed the night of your first shower and do not  sleep with pets. Day of Surgery : Do not apply any lotions/deodorants the morning of surgery.  Please wear clean clothes to the hospital/surgery center.  FAILURE TO FOLLOW THESE INSTRUCTIONS MAY RESULT IN THE CANCELLATION OF YOUR SURGERY  PATIENT SIGNATURE_________________________________  NURSE SIGNATURE__________________________________  ________________________________________________________________________

## 2020-12-21 NOTE — Progress Notes (Signed)
COVID Vaccine Completed: Date COVID Vaccine completed: Has received booster: COVID vaccine manufacturer: [place]   Date of COVID positive in last 90 days:  PCP - Jani Gravel, MD Cardiologist -   Chest x-ray - CT chest 10-17-20 Epic EKG -  Stress Test -  ECHO -  Cardiac Cath -  Pacemaker/ICD device last checked: Spinal Cord Stimulator:  Sleep Study -  CPAP -   Fasting Blood Sugar -  Checks Blood Sugar _____ times a day  Blood Thinner Instructions: Aspirin Instructions: Last Dose:  Activity level:  Can go up a flight of stairs and perform activities of daily living without stopping and without symptoms of chest pain or shortness of breath.   Able to exercise without symptoms  Unable to go up a flight of stairs without symptoms of      Anesthesia review: LBBB.  Cardiomegaly with CAD noted on CT chest   Patient denies shortness of breath, fever, cough and chest pain at PAT appointment   Patient verbalized understanding of instructions that were given to them at the PAT appointment. Patient was also instructed that they will need to review over the PAT instructions again at home before surgery.

## 2020-12-24 ENCOUNTER — Other Ambulatory Visit: Payer: Self-pay

## 2020-12-24 ENCOUNTER — Ambulatory Visit (HOSPITAL_COMMUNITY)
Admission: RE | Admit: 2020-12-24 | Discharge: 2020-12-24 | Disposition: A | Discharge status: Home / Self Care | Payer: Medicare Other | Source: Ambulatory Visit | Attending: Surgery | Admitting: Surgery

## 2020-12-24 DIAGNOSIS — R221 Localized swelling, mass and lump, neck: Secondary | ICD-10-CM | POA: Diagnosis not present

## 2020-12-25 ENCOUNTER — Encounter (HOSPITAL_COMMUNITY)
Admission: RE | Admit: 2020-12-25 | Discharge: 2020-12-25 | Disposition: A | Discharge status: Home / Self Care | Payer: Medicare Other | Source: Ambulatory Visit | Attending: Surgery | Admitting: Surgery

## 2020-12-25 ENCOUNTER — Encounter (HOSPITAL_COMMUNITY): Payer: Self-pay

## 2020-12-25 DIAGNOSIS — Z01812 Encounter for preprocedural laboratory examination: Secondary | ICD-10-CM | POA: Insufficient documentation

## 2020-12-25 HISTORY — DX: Pneumonia, unspecified organism: J18.9

## 2020-12-25 HISTORY — DX: Dyspnea, unspecified: R06.00

## 2020-12-25 LAB — GLUCOSE, CAPILLARY: Glucose-Capillary: 119 mg/dL — ABNORMAL HIGH (ref 70–99)

## 2020-12-25 NOTE — Progress Notes (Addendum)
COVID Vaccine Completed: x3 Date COVID Vaccine completed: 09/15/19, 10/13/19 Has received booster: 06/04/20 COVID vaccine manufacturer: [employer]   Date of COVID positive in last 90 days: N/A  PCP - Janie Morning, MD Cardiologist - 10+ years ago, unsure of physicians name  Chest x-ray - CT chest 10-17-20 Epic EKG - 12/24/20 Epic Stress Test -  ECHO -  Cardiac Cath - 10+ years ago per pt Pacemaker/ICD device last checked: Spinal Cord Stimulator:  Sleep Study - N/A CPAP -   Fasting Blood Sugar - 88-137 Checks Blood Sugar __1___ times a day  Blood Thinner Instructions: N/A Aspirin Instructions: Last Dose:  Activity level:   Can go up a flight of stairs and perform activities of daily living without stopping and without symptoms of chest pain. Pt states she has SOB with exertion. Able to perform ADLs, pt lives alone.    Anesthesia review: LBBB.  Cardiomegaly with CAD noted on CT chest, stop bang 5  Pt has large neck mass that was evaluated by Konrad Felix and Dr. Jillyn Hidden  Patient denies shortness of breath, fever, cough and chest pain at PAT appointment   Patient verbalized understanding of instructions that were given to them at the PAT appointment. Patient was also instructed that they will need to review over the PAT instructions again at home before surgery.

## 2020-12-25 NOTE — Progress Notes (Addendum)
   12/25/20 1114  OBSTRUCTIVE SLEEP APNEA  Have you ever been diagnosed with sleep apnea through a sleep study? No  Do you snore loudly (loud enough to be heard through closed doors)?  1  Do you often feel tired, fatigued, or sleepy during the daytime (such as falling asleep during driving or talking to someone)? 0  Has anyone observed you stop breathing during your sleep? 0  Do you have, or are you being treated for high blood pressure? 1  BMI more than 35 kg/m2? 1  Age > 50 (1-yes) 1  Neck circumference greater than:Female 16 inches or larger, Female 17inches or larger? 1  Female Gender (Yes=1) 0  Obstructive Sleep Apnea Score 5  Score 5 or greater  Results sent to PCP

## 2020-12-25 NOTE — Progress Notes (Signed)
Anesthesia Chart Review   Case: [id number] Date/Time: 01/04/21 1215   Procedure: EXCISION SOFT TISSUE MASS RIGHT NECK (Right )   Anesthesia type: General   Pre-op diagnosis: SOFT TISSUE MASS RIGHT NECK   Location: [facility] ROOM 01 / [facility]   Surgeons: Armandina Gemma, MD      DISCUSSION:70 y.o. never smoker with h/o PONV, GERD, chronic LBBB, soft tissue mass right neck scheduled for above procedure 01/04/2021 with Dr. Armandina Gemma.   Nuclear stress test on 05/21/09 showed baseline resting ECG left bundle branch block. Normal myocardial perfusion scan demonstrating an attenuation artifact in the anterior region of the myocardium. No ischemia or infarct/scar is seen in the remaining myocardium. This is a low risk scan. Study was technically difficult due to severe breast and diaphragmatic attenuation. Large amount of gut activity present. There is no distinct area of ischemia present.  Echocardiogram on 03/28/08 showed estimated LVEF 55-65%, mild tricuspid insufficiency, mildly elevated pulmonary artery pressure, flat mitral valve closure, nondiagnostic for mitral valve prolapse, mild mitral annular calcification with trace mitral regurgitation.  Pt denies cv sx, reports she can go up a flight of stairs and do housework without sx.   Neck mass evaluated by Dr. Jillyn Hidden during PAT visit.  Anticipate pt can proceed with planned procedure barring acute status change.  VS: BP (!) 182/58   Pulse 65   Temp 36.8 C (Oral)   Resp 12   Ht 5\' 1"  (1.549 m)   Wt 91.4 kg   SpO2 98%   BMI 38.09 kg/m   PROVIDERS: Janie Morning, DO is PCP    LABS: Labs reviewed: Acceptable for surgery. (all labs ordered are listed, but only abnormal results are displayed)  Labs Reviewed  GLUCOSE, CAPILLARY - Abnormal; Notable for the following components:      Result Value   Glucose-Capillary 119 (*)    All other components within normal limits     IMAGES:   EKG: 12/24/2020 Rate 65 bpm  Normal sinus rhythm Left  axis deviation Left bundle branch block Abnormal ECG No significant change since last tracing  CV:  Past Medical History:  Diagnosis Date  . Anemia   . Anxiety    takes Xanax daily as needed  . Arthritis   . Back spasm    takes Flexeril daily as needed  . Cataract    left eye and immature  . Depression    but doesn't take any meds  . Diabetes mellitus without complication (HCC)    borderline but no meds required   . Diarrhea    takes Pepto every 6hrs as needed  . Dyspnea    with exertion  . Dysrhythmia    left bundle branch block-this goes all the way back to the early 80's  . Eosinophilic esophagitis   . 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 GERD (gastroesophageal reflux disease)    takes Dexilant daily  . Hashimoto's disease   . Headache(784.0)   . History of colon polyps   . Hyperlipidemia    but not on any meds  . Hypertension    takes Bystolic and Diovan daily  . Joint pain   . Joint swelling   . Other specified disorders of adrenal glands    small lesions  on adrenal gland  . Peripheral edema    takes Lasix daily  . Pneumonia    as a child  . PONV (postoperative nausea  and vomiting)   . Thyroid disease   . Urinary frequency   . Vertigo    takes Meclizine daily    Past Surgical History:  Procedure Laterality Date  . BIOPSY THYROID    . CARDIAC CATHETERIZATION  late 90's  . CHOLECYSTECTOMY N/A 11/01/2013   Procedure: LAPAROSCOPIC CHOLECYSTECTOMY;  Surgeon: Gwenyth Ober, MD;  Location: [facility];  Service: General;  Laterality: N/A;  . COLONOSCOPY    . cyst drained from leg Left   . DENTAL SURGERY     numerous times  . DIAGNOSTIC LAPAROSCOPY     d/t endometriosis;this was done multiple times  . ESOPHAGOGASTRODUODENOSCOPY    . LAPAROSCOPIC ENDOMETRIOSIS FULGURATION  1980  . reconstruction done to right ureter    . right ureter ruptured  1988   stent was placed   . TONSILLECTOMY AND ADENOIDECTOMY  1955  .  ureter stent removed    . VAGINAL HYSTERECTOMY    . WISDOM TOOTH EXTRACTION      MEDICATIONS: . ALPRAZolam (XANAX) 0.25 MG tablet  . BYSTOLIC 10 MG tablet  . cyclobenzaprine (FLEXERIL) 10 MG tablet  . dexlansoprazole (DEXILANT) 60 MG capsule  . diphenhydrAMINE (BENADRYL) 25 MG tablet  . furosemide (LASIX) 40 MG tablet  . hydrALAZINE (APRESOLINE) 50 MG tablet  . ibuprofen (ADVIL,MOTRIN) 200 MG tablet  . metFORMIN (GLUCOPHAGE) 500 MG tablet  . ondansetron (ZOFRAN) 4 MG tablet  . oxyCODONE-acetaminophen (PERCOCET/ROXICET) 5-325 MG tablet  . Pitavastatin Calcium (LIVALO) 2 MG TABS  . promethazine (PHENERGAN) 12.5 MG tablet  . Tafluprost, PF, (ZIOPTAN) 0.0015 % SOLN  . valsartan (DIOVAN) 320 MG tablet   No current facility-administered medications for this encounter.    Konrad Felix, PA-C [facility]-Surgical Testing [phone]

## 2020-12-25 NOTE — Anesthesia Preprocedure Evaluation (Addendum)
Anesthesia Evaluation  Patient identified by MRN, date of birth, ID band Patient awake    Reviewed: Allergy & Precautions, NPO status , Patient's Chart, lab work & pertinent test results  History of Anesthesia Complications (+) PONV  Airway Mallampati: I  TM Distance: >3 FB Neck ROM: Full    Dental no notable dental hx. (+) Teeth Intact, Dental Advisory Given   Pulmonary shortness of breath and with exertion,    Pulmonary exam normal breath sounds clear to auscultation       Cardiovascular hypertension, Pt. on medications Normal cardiovascular exam Rhythm:Regular Rate:Normal  Known LBBB  Nuclear stress test on 05/21/09 showed baseline resting ECG left bundle branch block. Normal myocardial perfusion scan demonstrating an attenuation artifact in the anterior region of the myocardium. No ischemia or infarct/scar is seen in the remaining myocardium. This is a low risk scan. Study was technically difficult due to severe breast and diaphragmatic attenuation. Large amount of gut activity present. There is no distinct area of ischemia present.  Echocardiogram on 03/28/08 showed estimated LVEF 55-65%, mild tricuspid insufficiency, mildly elevated pulmonary artery pressure, flat mitral valve closure, nondiagnostic for mitral valve prolapse, mild mitral annular calcification with trace mitral regurgitation.   Neuro/Psych  Headaches, PSYCHIATRIC DISORDERS Anxiety Depression    GI/Hepatic Neg liver ROS, GERD  Controlled,  Endo/Other  negative endocrine ROSdiabetes, Type 2, Oral Hypoglycemic Agents  Renal/GU negative Renal ROS  negative genitourinary   Musculoskeletal negative musculoskeletal ROS (+)   Abdominal   Peds  Hematology negative hematology ROS (+)   Anesthesia Other Findings   Reproductive/Obstetrics                           Anesthesia Physical Anesthesia Plan  ASA: III  Anesthesia Plan:  General   Post-op Pain Management:    Induction: Intravenous  PONV Risk Score and Plan: 4 or greater and Midazolam, Dexamethasone, Ondansetron, Aprepitant and TIVA  Airway Management Planned: Oral ETT  Additional Equipment:   Intra-op Plan:   Post-operative Plan: Extubation in OR  Informed Consent: I have reviewed the patients History and Physical, chart, labs and discussed the procedure including the risks, benefits and alternatives for the proposed anesthesia with the patient or authorized representative who has indicated his/her understanding and acceptance.     Dental advisory given  Plan Discussed with: CRNA  Anesthesia Plan Comments:       Anesthesia Quick Evaluation

## 2020-12-31 ENCOUNTER — Encounter (HOSPITAL_COMMUNITY): Payer: Self-pay | Admitting: Surgery

## 2020-12-31 DIAGNOSIS — R221 Localized swelling, mass and lump, neck: Secondary | ICD-10-CM | POA: Diagnosis present

## 2020-12-31 NOTE — H&P (Signed)
General Surgery [facility]  Whitney Muse DOB: 05/16/1951 Widowed / Language: Cleophus Molt / Race: White Female   History of Present Illness   The patient is a 70 year old female who presents with a soft tissue mass.  CHIEF COMPLAINT: soft tissue mass right neck, multiple thyroid nodules  Patient was seen by my partner, Dr. Greer Pickerel, for evaluation of a soft tissue mass in the right lateral neck and supraclavicular fossa as well as bilateral thyroid nodules.  Patient underwent further evaluation including a CT scan of the neck and chest and an attempted aspiration biopsy of the left-sided thyroid nodule.  Fine-needle aspiration biopsy was unrevealing as inadequate material was obtained for evaluation.  Upon review of the ultrasound report, there are no worrisome findings identified.  These nodules can safely be followed with continued ultrasound surveillance.  TSH level from September 2021 was normal at 1.800.  CT scan of the neck and chest is reviewed.  This shows a large, greater than 10 cm, soft tissue mass in the right neck and supraclavicular fossa consistent with a large lipoma.  Patient feels like this mass has continued to slowly enlarge over the past several years.  Patient has begun to note some pressure radiating into the neck.  Patient is unsure stated in proceeding with surgical resection.   Problem List/Past Medical SEVERE OBESITY (E66.01)   MULTIPLE THYROID NODULES (E04.2)   MASS OF RIGHT SIDE OF NECK (R22.1)    Past Surgical History  Cataract Surgery   Left. Colon Polyp Removal - Colonoscopy   Gallbladder Surgery - Laparoscopic   Hysterectomy (not due to cancer) - Partial   Oral Surgery   Tonsillectomy    Diagnostic Studies History Colonoscopy   >10 years ago Mammogram   >3 years ago Pap Smear   >5 years ago  Allergies  Vioxx *ANALGESICS - ANTI-INFLAMMATORY*   Abdominal pain. Dyes   Hives. IV dye Altace *ANTIHYPERTENSIVES*   Hives. amLODIPine  Bes+SyrSpend SF *CALCIUM CHANNEL BLOCKERS*   flushing cloNIDine *ANTIHYPERTENSIVES*   Rash. Crestor *ANTIHYPERLIPIDEMICS*   muscle weakness Erythromycin *DERMATOLOGICALS*   Nausea. Macrobid *ANTI-INFECTIVE AGENTS - MISC.*   facial swelling MetFORMIN & Diet Manage Prod *ANTIDIABETICS*   Diarrhea. metroNIDAZOLE *ANTI-INFECTIVE AGENTS - MISC.*   increased BP, swelling PROzac *ANTIDEPRESSANTS*   Headache. Sulfur *CHEMICALS*   Difficulty breathing. Welchol *ANTIHYPERLIPIDEMICS*   facial swelling Tetanus-Diphtheria Toxoids Td *TOXOIDS*   Hives. Jardiance *ANTIDIABETICS*   facial swelling penicillAMINE *MISCELLANEOUS THERAPEUTIC CLASSES*   Difficulty breathing, Hives. Strawberry (Diagnostic) *DIAGNOSTIC PRODUCTS*   Anaphylaxis. strawberries - anaphylaxis has epipen Allergies Reconciled    Medication History  ALPRAZolam  (0.25MG  Tablet, Oral) Active. Bystolic  (10MG  Tablet, Oral) Active. Cyclobenzaprine HCl  (10MG  Tablet, Oral) Active. Dexilant  (60MG  Capsule DR, Oral) Active. EPINEPHrine  (0.3MG /0.3ML Soln Auto-inj, Injection) Active. Furosemide  (40MG  Tablet, Oral) Active. Livalo  (2MG  Tablet, Oral) Active. metFORMIN HCl  (500MG  Tablet, Oral) Active. Zioptan  (0.0015% Solution, Ophthalmic) Active. Valsartan  (320MG  Tablet, Oral) Active. Phenergan  (12.5MG  Tablet, Oral) Active. Vitamin B Complex  (Oral) Active. Vitamin C  (500MG  Capsule, Oral) Active. Medications Reconciled   Social History  Alcohol use   Occasional alcohol use. Caffeine use   Carbonated beverages, Coffee, Tea. No drug use   Tobacco use   Never smoker.  Family History  Arthritis   Mother, Sister. Cancer   Mother. Colon Cancer   Mother. Depression   Father, Sister, Son. Diabetes Mellitus   Son. Hypertension   Mother.  Kidney Disease   Son. Migraine Headache   Mother. Ovarian Cancer   Mother. Thyroid problems   Mother, Sister.  Pregnancy / Birth History  Age at menarche   30 years. Age of menopause    <45 Gravida   2 Maternal age   54-20 Para   2  Other Problems  Anxiety Disorder   Arthritis   Back Pain   Bladder Problems   Depression   Gastroesophageal Reflux Disease   General anesthesia - complications   Hemorrhoids   Hypercholesterolemia   Oophorectomy   Left. PRIMARY HYPERTENSION (I10)   DIABETES MELLITUS TYPE 2 IN OBESE (E11.69)     Physical Exam   GENERAL APPEARANCE Development: normal Nutritional status: normal Gross deformities: none  SKIN Rash, lesions, ulcers: none Induration, erythema: none Nodules: none palpable  EYES Conjunctiva and lids: normal Pupils: equal and reactive Iris: normal bilaterally  EARS, NOSE, MOUTH, THROAT External ears: no lesion or deformity External nose: no lesion or deformity Hearing: grossly normal Due to Covid-19 pandemic, patient is wearing a mask.  NECK Symmetric: no Trachea: midline Thyroid: no palpable nodules in the thyroid bed There is a dominant soft tissue mass involving the right lateral neck and supraclavicular fossa, extending from anterior to the clavicle to the cephalad portion of the trapezius muscle on the right. This mass is uniform in consistency, rubbery, consistent with a large lipoma. There are no overlying cutaneous changes.  CHEST Respiratory effort: normal Retraction or accessory muscle use: no Breath sounds: normal bilaterally Rales, rhonchi, wheeze: none  CARDIOVASCULAR Auscultation: regular rhythm, normal rate Murmurs: none Pulses: radial pulse 2+ palpable Lower extremity edema: none  MUSCULOSKELETAL Station and gait: normal; slow and careful due to knee Digits and nails: no clubbing or cyanosis Muscle strength: grossly normal all extremities Range of motion: grossly normal all extremities Deformity: none  LYMPHATIC Cervical: none palpable Supraclavicular: none palpable  PSYCHIATRIC Oriented to person, place, and time: yes Mood and affect: normal for situation Judgment and  insight: appropriate for situation    Assessment & Plan   MASS OF RIGHT SIDE OF NECK (R22.1) MULTIPLE THYROID NODULES (E04.2)  Patient is referred by Dr. Greer Pickerel for my evaluation and recommendations regarding a large right neck mass and multiple thyroid nodules.  I reviewed the CT scan images and report as well as the ultrasound report and fine-needle aspiration biopsy results.  I shared these with the patient.  At this time, the patient has stable bilateral thyroid nodules without worrisome features.  I would not recommend any surgical intervention for the thyroid.  I would plan a repeat ultrasound and TSH level in one year followed by physical examination here in our office.  As for the soft tissue mass in the right lateral neck and supraclavicular fossa, this has the appearance of a large lipoma.  It does not appear to involve any of the underlying neurologic structures.  We discussed the surgical approach to excision.  We discussed the need for a subcutaneous drain.  I would plan to do this in a left lateral decubitus position.  I would plan to keep her overnight for observation and for teaching her about drain care.  Patient understands.  We discussed potential complications including involvement of underlying neurologic structures including the spinal accessory nerve.  Patient wishes to proceed with surgery in the near future.  The risks and benefits of the procedure have been discussed at length with the patient.  The patient understands the proposed procedure, potential alternative treatments,  and the course of recovery to be expected.  All of the patient's questions have been answered at this time.  The patient wishes to proceed with surgery.  Armandina Gemma, MD [facility] Office: [phone]

## 2021-01-01 ENCOUNTER — Other Ambulatory Visit (HOSPITAL_COMMUNITY)
Admission: RE | Admit: 2021-01-01 | Discharge: 2021-01-01 | Disposition: A | Discharge status: Home / Self Care | Payer: Medicare Other | Source: Ambulatory Visit | Attending: Surgery | Admitting: Surgery

## 2021-01-02 ENCOUNTER — Other Ambulatory Visit (HOSPITAL_COMMUNITY)
Admission: RE | Admit: 2021-01-02 | Discharge: 2021-01-02 | Disposition: A | Discharge status: Home / Self Care | Payer: Medicare Other | Source: Ambulatory Visit | Attending: Surgery | Admitting: Surgery

## 2021-01-02 ENCOUNTER — Telehealth (HOSPITAL_COMMUNITY): Payer: Self-pay

## 2021-01-02 DIAGNOSIS — Z20822 Contact with and (suspected) exposure to covid-19: Secondary | ICD-10-CM | POA: Insufficient documentation

## 2021-01-02 DIAGNOSIS — Z01812 Encounter for preprocedural laboratory examination: Secondary | ICD-10-CM | POA: Diagnosis present

## 2021-01-02 LAB — SARS CORONAVIRUS 2 (TAT 6-24 HRS): SARS Coronavirus 2: NEGATIVE

## 2021-01-02 NOTE — Telephone Encounter (Signed)
01/02/21 - Contacted surgeon's office  - spoke to [place] - advised of Patient's Covid19 lab results - QNS. [place] advised she would inform the surgery scheduler to contact the patient to have her return to testing site for retesting, MBM

## 2021-01-04 ENCOUNTER — Ambulatory Visit (HOSPITAL_COMMUNITY): Payer: Medicare Other | Admitting: Anesthesiology

## 2021-01-04 ENCOUNTER — Other Ambulatory Visit: Payer: Self-pay

## 2021-01-04 ENCOUNTER — Ambulatory Visit (HOSPITAL_COMMUNITY)
Admission: RE | Admit: 2021-01-04 | Discharge: 2021-01-05 | Disposition: A | Discharge status: Home / Self Care | Payer: Medicare Other | Attending: Surgery | Admitting: Surgery

## 2021-01-04 ENCOUNTER — Encounter (HOSPITAL_COMMUNITY)
Admission: RE | Disposition: A | Discharge status: Home / Self Care | Payer: Self-pay | Source: Home / Self Care | Attending: Surgery

## 2021-01-04 ENCOUNTER — Ambulatory Visit (HOSPITAL_COMMUNITY): Payer: Medicare Other | Admitting: Physician Assistant

## 2021-01-04 ENCOUNTER — Encounter (HOSPITAL_COMMUNITY): Payer: Self-pay | Admitting: Surgery

## 2021-01-04 DIAGNOSIS — Z881 Allergy status to other antibiotic agents status: Secondary | ICD-10-CM | POA: Diagnosis not present

## 2021-01-04 DIAGNOSIS — Z887 Allergy status to serum and vaccine status: Secondary | ICD-10-CM | POA: Diagnosis not present

## 2021-01-04 DIAGNOSIS — D170 Benign lipomatous neoplasm of skin and subcutaneous tissue of head, face and neck: Secondary | ICD-10-CM | POA: Diagnosis not present

## 2021-01-04 DIAGNOSIS — Z6838 Body mass index (BMI) 38.0-38.9, adult: Secondary | ICD-10-CM | POA: Diagnosis not present

## 2021-01-04 DIAGNOSIS — Z886 Allergy status to analgesic agent status: Secondary | ICD-10-CM | POA: Insufficient documentation

## 2021-01-04 DIAGNOSIS — Z7984 Long term (current) use of oral hypoglycemic drugs: Secondary | ICD-10-CM | POA: Insufficient documentation

## 2021-01-04 DIAGNOSIS — R221 Localized swelling, mass and lump, neck: Secondary | ICD-10-CM | POA: Diagnosis present

## 2021-01-04 DIAGNOSIS — M7989 Other specified soft tissue disorders: Secondary | ICD-10-CM | POA: Diagnosis present

## 2021-01-04 DIAGNOSIS — Z888 Allergy status to other drugs, medicaments and biological substances status: Secondary | ICD-10-CM | POA: Diagnosis not present

## 2021-01-04 DIAGNOSIS — R222 Localized swelling, mass and lump, trunk: Secondary | ICD-10-CM | POA: Diagnosis present

## 2021-01-04 DIAGNOSIS — E042 Nontoxic multinodular goiter: Secondary | ICD-10-CM | POA: Insufficient documentation

## 2021-01-04 DIAGNOSIS — Z79899 Other long term (current) drug therapy: Secondary | ICD-10-CM | POA: Insufficient documentation

## 2021-01-04 HISTORY — PX: EXCISION MASS NECK: SHX6703

## 2021-01-04 LAB — GLUCOSE, CAPILLARY
Glucose-Capillary: 94 mg/dL (ref 70–99)
Glucose-Capillary: 69 mg/dL — ABNORMAL LOW (ref 70–99)
Glucose-Capillary: 211 mg/dL — ABNORMAL HIGH (ref 70–99)

## 2021-01-04 SURGERY — EXCISION, MASS, NECK
Anesthesia: General | Laterality: Right

## 2021-01-04 MED ORDER — NEBIVOLOL HCL 10 MG PO TABS
10.0000 mg | ORAL_TABLET | Freq: Every day | ORAL | Status: DC
  Administered 2021-01-05: 10 mg via ORAL
  Filled 2021-01-04: qty 1

## 2021-01-04 MED ORDER — APREPITANT 40 MG PO CAPS: 40.0000 mg | ORAL_CAPSULE | Freq: Once | ORAL | Status: AC

## 2021-01-04 MED ORDER — IRBESARTAN 150 MG PO TABS
300.0000 mg | ORAL_TABLET | Freq: Every day | ORAL | Status: DC
  Administered 2021-01-04: 300 mg via ORAL
  Filled 2021-01-04: qty 2

## 2021-01-04 MED ORDER — FENTANYL CITRATE (PF) 100 MCG/2ML IJ SOLN
INTRAMUSCULAR | Status: AC
  Filled 2021-01-04: qty 2

## 2021-01-04 MED ORDER — CHLORHEXIDINE GLUCONATE 0.12 % MT SOLN
15.0000 mL | Freq: Once | OROMUCOSAL | Status: AC
  Administered 2021-01-04: 15 mL via OROMUCOSAL

## 2021-01-04 MED ORDER — ORAL CARE MOUTH RINSE: 15.0000 mL | Freq: Once | OROMUCOSAL | Status: AC

## 2021-01-04 MED ORDER — LATANOPROST 0.005 % OP SOLN
1.0000 [drp] | Freq: Every day | OPHTHALMIC | Status: DC
  Administered 2021-01-04: 1 [drp] via OPHTHALMIC
  Filled 2021-01-04: qty 2.5

## 2021-01-04 MED ORDER — SODIUM CHLORIDE 0.45 % IV SOLN: INTRAVENOUS | Status: DC

## 2021-01-04 MED ORDER — KETOROLAC TROMETHAMINE 30 MG/ML IJ SOLN
INTRAMUSCULAR | Status: AC
  Filled 2021-01-04: qty 1

## 2021-01-04 MED ORDER — ROCURONIUM BROMIDE 100 MG/10ML IV SOLN
INTRAVENOUS | Status: DC | PRN
  Administered 2021-01-04: 80 mg via INTRAVENOUS

## 2021-01-04 MED ORDER — DIPHENHYDRAMINE HCL 25 MG PO CAPS: 25.0000 mg | ORAL_CAPSULE | Freq: Four times a day (QID) | ORAL | Status: DC | PRN

## 2021-01-04 MED ORDER — TRAMADOL HCL 50 MG PO TABS: 50.0000 mg | ORAL_TABLET | Freq: Four times a day (QID) | ORAL | 0 refills | Status: AC | PRN

## 2021-01-04 MED ORDER — EPHEDRINE 5 MG/ML INJ
INTRAVENOUS | Status: AC
  Filled 2021-01-04: qty 10

## 2021-01-04 MED ORDER — ONDANSETRON HCL 4 MG/2ML IJ SOLN
4.0000 mg | Freq: Four times a day (QID) | INTRAMUSCULAR | Status: DC | PRN
  Administered 2021-01-04: 4 mg via INTRAVENOUS
  Filled 2021-01-04 (×2): qty 2

## 2021-01-04 MED ORDER — ACETAMINOPHEN 500 MG PO TABS
1000.0000 mg | ORAL_TABLET | Freq: Once | ORAL | Status: AC
  Administered 2021-01-04: 1000 mg via ORAL
  Filled 2021-01-04: qty 2

## 2021-01-04 MED ORDER — ONDANSETRON HCL 4 MG/2ML IJ SOLN
INTRAMUSCULAR | Status: DC | PRN
  Administered 2021-01-04: 8 mg via INTRAVENOUS

## 2021-01-04 MED ORDER — FENTANYL CITRATE (PF) 100 MCG/2ML IJ SOLN
25.0000 ug | INTRAMUSCULAR | Status: DC | PRN
  Administered 2021-01-04: 50 ug via INTRAVENOUS

## 2021-01-04 MED ORDER — KETOROLAC TROMETHAMINE 30 MG/ML IJ SOLN
INTRAMUSCULAR | Status: DC | PRN
  Administered 2021-01-04: 30 mg via INTRAVENOUS

## 2021-01-04 MED ORDER — TAFLUPROST (PF) 0.0015 % OP SOLN: 1.0000 [drp] | Freq: Every day | OPHTHALMIC | Status: DC

## 2021-01-04 MED ORDER — VANCOMYCIN HCL IN DEXTROSE 1-5 GM/200ML-% IV SOLN
1000.0000 mg | INTRAVENOUS | Status: AC
  Administered 2021-01-04: 1000 mg via INTRAVENOUS
  Filled 2021-01-04: qty 200

## 2021-01-04 MED ORDER — HYDRALAZINE HCL 50 MG PO TABS
50.0000 mg | ORAL_TABLET | Freq: Three times a day (TID) | ORAL | Status: DC
  Administered 2021-01-04 – 2021-01-05 (×3): 50 mg via ORAL
  Filled 2021-01-04 (×3): qty 1

## 2021-01-04 MED ORDER — PROPOFOL 1000 MG/100ML IV EMUL
INTRAVENOUS | Status: AC
  Filled 2021-01-04: qty 100

## 2021-01-04 MED ORDER — ACETAMINOPHEN 650 MG RE SUPP: 650.0000 mg | Freq: Four times a day (QID) | RECTAL | Status: DC | PRN

## 2021-01-04 MED ORDER — FENTANYL CITRATE (PF) 100 MCG/2ML IJ SOLN
INTRAMUSCULAR | Status: DC | PRN
  Administered 2021-01-04: 50 ug via INTRAVENOUS
  Administered 2021-01-04 (×2): 25 ug via INTRAVENOUS
  Administered 2021-01-04: 50 ug via INTRAVENOUS
  Administered 2021-01-04 (×2): 25 ug via INTRAVENOUS

## 2021-01-04 MED ORDER — BUPIVACAINE-EPINEPHRINE (PF) 0.25% -1:200000 IJ SOLN
INTRAMUSCULAR | Status: AC
  Filled 2021-01-04: qty 30

## 2021-01-04 MED ORDER — CHLORHEXIDINE GLUCONATE CLOTH 2 % EX PADS
6.0000 | MEDICATED_PAD | Freq: Once | CUTANEOUS | Status: DC
  Administered 2021-01-04: 6 via TOPICAL

## 2021-01-04 MED ORDER — ACETAMINOPHEN 325 MG PO TABS: 650.0000 mg | ORAL_TABLET | Freq: Four times a day (QID) | ORAL | Status: DC | PRN

## 2021-01-04 MED ORDER — ONDANSETRON HCL 4 MG/2ML IJ SOLN
INTRAMUSCULAR | Status: AC
  Filled 2021-01-04: qty 2

## 2021-01-04 MED ORDER — ONDANSETRON 4 MG PO TBDP
4.0000 mg | ORAL_TABLET | Freq: Four times a day (QID) | ORAL | Status: DC | PRN
  Filled 2021-01-04: qty 1

## 2021-01-04 MED ORDER — PROPOFOL 10 MG/ML IV BOLUS
INTRAVENOUS | Status: DC | PRN
  Administered 2021-01-04: 150 mg via INTRAVENOUS

## 2021-01-04 MED ORDER — CHLORHEXIDINE GLUCONATE CLOTH 2 % EX PADS: 6.0000 | MEDICATED_PAD | Freq: Once | CUTANEOUS | Status: DC

## 2021-01-04 MED ORDER — LIDOCAINE HCL (CARDIAC) PF 100 MG/5ML IV SOSY
PREFILLED_SYRINGE | INTRAVENOUS | Status: DC | PRN
  Administered 2021-01-04: 100 mg via INTRAVENOUS

## 2021-01-04 MED ORDER — TRAMADOL HCL 50 MG PO TABS: 50.0000 mg | ORAL_TABLET | Freq: Four times a day (QID) | ORAL | Status: DC | PRN

## 2021-01-04 MED ORDER — EPHEDRINE SULFATE 50 MG/ML IJ SOLN
INTRAMUSCULAR | Status: DC | PRN
  Administered 2021-01-04: 10 mg via INTRAVENOUS
  Administered 2021-01-04: 7 mg via INTRAVENOUS

## 2021-01-04 MED ORDER — APREPITANT 40 MG PO CAPS
ORAL_CAPSULE | ORAL | Status: AC
  Administered 2021-01-04: 40 mg via ORAL
  Filled 2021-01-04: qty 1

## 2021-01-04 MED ORDER — LACTATED RINGERS IV SOLN: INTRAVENOUS | Status: DC

## 2021-01-04 MED ORDER — ALPRAZOLAM 0.25 MG PO TABS: 0.2500 mg | ORAL_TABLET | Freq: Two times a day (BID) | ORAL | Status: DC | PRN

## 2021-01-04 MED ORDER — DEXAMETHASONE SODIUM PHOSPHATE 10 MG/ML IJ SOLN
INTRAMUSCULAR | Status: AC
  Filled 2021-01-04: qty 1

## 2021-01-04 MED ORDER — PROPOFOL 10 MG/ML IV BOLUS
INTRAVENOUS | Status: AC
  Filled 2021-01-04: qty 20

## 2021-01-04 MED ORDER — OXYCODONE HCL 5 MG PO TABS
5.0000 mg | ORAL_TABLET | ORAL | Status: DC | PRN
  Administered 2021-01-04 – 2021-01-05 (×2): 10 mg via ORAL
  Filled 2021-01-04 (×2): qty 2

## 2021-01-04 MED ORDER — METFORMIN HCL 500 MG PO TABS
500.0000 mg | ORAL_TABLET | Freq: Every day | ORAL | Status: DC
  Administered 2021-01-04: 500 mg via ORAL
  Filled 2021-01-04: qty 1

## 2021-01-04 MED ORDER — LIDOCAINE 2% (20 MG/ML) 5 ML SYRINGE
INTRAMUSCULAR | Status: AC
  Filled 2021-01-04: qty 5

## 2021-01-04 MED ORDER — 0.9 % SODIUM CHLORIDE (POUR BTL) OPTIME
TOPICAL | Status: DC | PRN
  Administered 2021-01-04: 1000 mL

## 2021-01-04 MED ORDER — HYDROMORPHONE HCL 1 MG/ML IJ SOLN
1.0000 mg | INTRAMUSCULAR | Status: DC | PRN
  Administered 2021-01-04: 1 mg via INTRAVENOUS
  Filled 2021-01-04: qty 1

## 2021-01-04 MED ORDER — SUGAMMADEX SODIUM 200 MG/2ML IV SOLN
INTRAVENOUS | Status: DC | PRN
  Administered 2021-01-04: 200 mg via INTRAVENOUS

## 2021-01-04 MED ORDER — PROPOFOL 500 MG/50ML IV EMUL
INTRAVENOUS | Status: DC | PRN
  Administered 2021-01-04: 150 ug/kg/min via INTRAVENOUS
  Administered 2021-01-04: 140 ug/kg/min via INTRAVENOUS

## 2021-01-04 MED ORDER — DEXAMETHASONE SODIUM PHOSPHATE 10 MG/ML IJ SOLN
INTRAMUSCULAR | Status: DC | PRN
  Administered 2021-01-04: 10 mg via INTRAVENOUS

## 2021-01-04 MED ORDER — FUROSEMIDE 40 MG PO TABS
40.0000 mg | ORAL_TABLET | Freq: Every day | ORAL | Status: DC
  Administered 2021-01-05: 40 mg via ORAL
  Filled 2021-01-04: qty 1

## 2021-01-04 SURGICAL SUPPLY — 33 items
ADH SKN CLS APL DERMABOND .7 (GAUZE/BANDAGES/DRESSINGS) ×1
APL PRP STRL LF DISP 70% ISPRP (MISCELLANEOUS) ×1
BLADE HEX COATED 2.75 (ELECTRODE) ×1 IMPLANT
BLADE SURG 15 STRL LF DISP TIS (BLADE) ×1 IMPLANT
BLADE SURG 15 STRL SS (BLADE) ×3
CHLORAPREP W/TINT 26 (MISCELLANEOUS) ×4 IMPLANT
CLOSURE WOUND 1/2 X4 (GAUZE/BANDAGES/DRESSINGS)
COVER WAND RF STERILE (DRAPES) IMPLANT
DERMABOND ADVANCED (GAUZE/BANDAGES/DRESSINGS) ×2
DERMABOND ADVANCED .7 DNX12 (GAUZE/BANDAGES/DRESSINGS) IMPLANT
DISSECTOR ROUND CHERRY 3/8 STR (MISCELLANEOUS) IMPLANT
DRAIN CHANNEL 15F RND FF 3/16 (WOUND CARE) ×2 IMPLANT
DRAPE LAPAROTOMY T 98X78 PEDS (DRAPES) ×3 IMPLANT
ELECT PENCIL ROCKER SW 15FT (MISCELLANEOUS) ×2 IMPLANT
ELECT REM PT RETURN 15FT ADLT (MISCELLANEOUS) ×3 IMPLANT
GAUZE 4X4 16PLY RFD (DISPOSABLE) ×1 IMPLANT
GAUZE SPONGE 4X4 12PLY STRL (GAUZE/BANDAGES/DRESSINGS) IMPLANT
GLOVE SURG ORTHO LTX SZ8 (GLOVE) ×3 IMPLANT
GOWN STRL REUS W/TWL LRG LVL3 (GOWN DISPOSABLE) ×3 IMPLANT
GOWN STRL REUS W/TWL XL LVL3 (GOWN DISPOSABLE) ×6 IMPLANT
HEMOSTAT SURGICEL 2X4 FIBR (HEMOSTASIS) ×1 IMPLANT
KIT BASIN OR (CUSTOM PROCEDURE TRAY) ×3 IMPLANT
KIT TURNOVER KIT A (KITS) ×3 IMPLANT
NS IRRIG 1000ML POUR BTL (IV SOLUTION) ×3 IMPLANT
PACK BASIC VI WITH GOWN DISP (CUSTOM PROCEDURE TRAY) ×3 IMPLANT
PENCIL SMOKE EVACUATOR (MISCELLANEOUS) IMPLANT
SHEARS HARMONIC 9CM CVD (BLADE) ×2 IMPLANT
SPONGE DRAIN TRACH 4X4 STRL 2S (GAUZE/BANDAGES/DRESSINGS) ×2 IMPLANT
STAPLER VISISTAT 35W (STAPLE) ×1 IMPLANT
STRIP CLOSURE SKIN 1/2X4 (GAUZE/BANDAGES/DRESSINGS) ×1 IMPLANT
SYR BULB IRRIG 60ML STRL (SYRINGE) ×3 IMPLANT
TOWEL OR 17X26 10 PK STRL BLUE (TOWEL DISPOSABLE) ×3 IMPLANT
TOWEL OR NON WOVEN STRL DISP B (DISPOSABLE) ×3 IMPLANT

## 2021-01-04 NOTE — Interval H&P Note (Signed)
History and Physical Interval Note:  01/04/2021 12:22 PM  Sarah Fuentes  has presented today for surgery, with the diagnosis of SOFT TISSUE MASS RIGHT NECK.  The various methods of treatment have been discussed with the patient and family. After consideration of risks, benefits and other options for treatment, the patient has consented to    Procedure(s): EXCISION SOFT TISSUE MASS RIGHT NECK (Right) as a surgical intervention.    The patient's history has been reviewed, patient examined, no change in status, stable for surgery.  I have reviewed the patient's chart and labs.  Questions were answered to the patient's satisfaction.    Armandina Gemma, MD [facility] Office: [place]

## 2021-01-04 NOTE — Transfer of Care (Signed)
Immediate Anesthesia Transfer of Care Note  Patient: Sarah Fuentes  Procedure(s) Performed: Procedure(s) (LRB): EXCISION SOFT TISSUE MASS RIGHT NECK (Right)  Patient Location: PACU  Anesthesia Type: General / TIVA   Level of Consciousness: awake, sedated, patient cooperative and responds to stimulation  Airway & Oxygen Therapy: Patient Spontanous Breathing and Patient connected to face mask oxygen  Post-op Assessment: Report given to PACU RN, Post -op Vital signs reviewed and stable and Patient moving all extremities  Post vital signs: Reviewed and stable  Complications: No apparent anesthesia complications

## 2021-01-04 NOTE — Plan of Care (Signed)
  Problem: Pain Managment: Goal: General experience of comfort will improve Outcome: Progressing   

## 2021-01-04 NOTE — Discharge Instructions (Signed)
  [facility] -- DISCHARGE INSTRUCTIONS  REMINDER:   Carry a list of your medications and allergies with you at all times  Call your pharmacy at least 1 week in advance to refill prescriptions  Do not mix any prescribed pain medicine with alcohol  Do not drive any motor vehicles while taking pain medication  Take medications with food unless otherwise directed  Follow-up appointments (date to return to physician): Please call [phone] to confirm your follow up appointment with your surgeon.  Call your Surgeon if you have:  Temperature greater than 101.0  Persistent nausea and vomiting  Severe uncontrolled pain  Redness, tenderness, or signs of infection (pain, swelling, redness, odor or green/yellow discharge around the site)  Difficulty breathing, headache or visual disturbances  Hives  Persistent dizziness or light-headedness  Any other questions or concerns you may have after discharge  In an emergency, call 911 or go to an Emergency Department at a nearby hospital.  Diet: Begin with liquids, and if they are tolerated, resume your usual diet.  Avoid spicy, greasy or heavy foods.  If you have nausea or vomiting, go back to liquids.  If you cannot keep liquids down, call your doctor.  Avoid alcohol consumption while on prescription pain medications. Good nutrition promotes healing. Increase fiber and fluids.   ADDITIONAL INSTRUCTIONS: Drain care as instructed.  Dr [facility] Office: [phone]

## 2021-01-04 NOTE — Anesthesia Postprocedure Evaluation (Signed)
Anesthesia Post Note  Patient: Sarah Fuentes  Procedure(s) Performed: EXCISION SOFT TISSUE MASS RIGHT NECK (Right)     Patient location during evaluation: PACU Anesthesia Type: General Level of consciousness: awake and alert Pain management: pain level controlled Vital Signs Assessment: post-procedure vital signs reviewed and stable Respiratory status: spontaneous breathing, nonlabored ventilation, respiratory function stable and patient connected to nasal cannula oxygen Cardiovascular status: blood pressure returned to baseline and stable Postop Assessment: no apparent nausea or vomiting Anesthetic complications: no   No notable events documented.  Last Vitals:  Vitals:   01/04/21 1500 01/04/21 1513  BP: (!) 155/71 (!) 173/78  Pulse: 80 95  Resp: 13 16  Temp: 36.5 C (!) 36.4 C  SpO2: 100% 96%    Last Pain:  Vitals:   01/04/21 1513  TempSrc: Oral  PainSc:                  Pavan Bring L Deja Kaigler

## 2021-01-04 NOTE — Anesthesia Procedure Notes (Signed)
Procedure Name: Intubation Date/Time: 01/04/2021 12:56 PM Performed by: Justice Rocher, CRNA Pre-anesthesia Checklist: Timeout performed, Emergency Drugs available, Suction available, Patient being monitored and Patient identified Patient Re-evaluated:Patient Re-evaluated prior to induction Oxygen Delivery Method: Circle system utilized Preoxygenation: Pre-oxygenation with 100% oxygen Induction Type: IV induction Ventilation: Mask ventilation without difficulty Laryngoscope Size: Mac and 3 Grade View: Grade II Tube type: Oral Tube size: 7.0 mm Number of attempts: 1 Airway Equipment and Method: Stylet and Oral airway Placement Confirmation: ETT inserted through vocal cords under direct vision, positive ETCO2 and breath sounds checked- equal and bilateral Secured at: 22 cm Tube secured with: Tape Dental Injury: Teeth and Oropharynx as per pre-operative assessment

## 2021-01-04 NOTE — Op Note (Signed)
Operative Note  Pre-operative Diagnosis:  soft tissue mass, right neck and supraclavicular fossa  Post-operative Diagnosis:  same  Surgeon:  Armandina Gemma, MD  Assistant:  none   Procedure:  Excision soft tissue mass right neck and supraclavicular fossa (18 x 12 x 6 cm)  Anesthesia:  general  Estimated Blood Loss:  minimal  Drains: 15Fr round fluted drain to bulb suction         Specimen: to pathology  Indications:  Patient was seen by my partner, Dr. Greer Pickerel, for evaluation of a soft tissue mass in the right lateral neck and supraclavicular fossa as well as bilateral thyroid nodules.  Patient underwent further evaluation including a CT scan of the neck and chest and an attempted aspiration biopsy of the left-sided thyroid nodule.  Fine-needle aspiration biopsy was unrevealing as inadequate material was obtained for evaluation.  Upon review of the ultrasound report, there are no worrisome findings identified.  These nodules can safely be followed with continued ultrasound surveillance.  TSH level from September 2021 was normal at 1.800.  CT scan of the neck and chest is reviewed.  This shows a large, greater than 10 cm, soft tissue mass in the right neck and supraclavicular fossa consistent with a large lipoma.  Patient feels like this mass has continued to slowly enlarge over the past several years.  Patient has begun to note some pressure radiating into the neck. Patient now comes to surgery for excision.  Procedure:  The patient was seen in the pre-op holding area. The risks, benefits, complications, treatment options, and expected outcomes were previously discussed with the patient. The patient agreed with the proposed plan and has signed the informed consent form.  The patient was brought to the operating room by the surgical team, identified as Whitney Muse and the procedure verified. A "time out" was completed and the above information confirmed.  Following induction of general  anesthesia, the patient was positioned and then prepped and draped in the usual aseptic fashion.  After ascertaining that an adequate level of anesthesia been achieved, an incision is made anterior laterally on the mass with a #15 blade.  Dissection was carried through the skin and into the subcutaneous tissues.  Hemostasis is achieved with the electrocautery.  Dissection is carried down to the mass.  There appears to be compressed subcutaneous tissues surrounding the actual mass.  The mass appears to be adipose tissue.  There does not appear to be a true capsule.  Using gentle blunt dissection the mass is mobilized.  Surrounding subcutaneous tissue and venous tributaries are divided using the harmonic scalpel.  Mass is mobilized circumferentially and delivered through the incision.  The mass does extend somewhat deeply into the supraclavicular fossa and tracks posteriorly.  Adipose tissue was divided under direct vision with the harmonic scalpel.  The mass forms somewhat of a neck in the deep supraclavicular fossa.  It is gently dissected out and elevated and divided under direct vision with the harmonic scalpel.  The entire mass is slowly excised and removed.  Ex vivo, the mass measures approximately 18 x 12 x 6 cm in size.  It appears to be benign adipose tissue.  It will be submitted in its entirety to pathology for review.  Wound is inspected and there is good hemostasis throughout.  A 15 French round fluted drain is brought in from an anterior and inferior stab wound.  Drain is secured to the skin with a 3-0 nylon suture.  Drain is  placed into the bed of the wound and curled around the edges circumferentially.  Subcutaneous tissues are closed with interrupted 3-0 Vicryl sutures.  Skin is closed with a running 4-0 Monocryl subcuticular suture.  Wound is washed and dried and Dermabond is applied as dressing.  A drain sponges placed around the base of the drain.  The drain is placed to bulb  suction.  Patient is awakened from anesthesia and transported to the recovery room.  The patient tolerated the procedure well.   Armandina Gemma, MD [facility] Office: [phone]

## 2021-01-04 NOTE — Progress Notes (Signed)
On call provider notified of elevated blood sugar reading  as per order to notify physician if CBG >140. No new orders.

## 2021-01-05 ENCOUNTER — Encounter (HOSPITAL_COMMUNITY): Payer: Self-pay | Admitting: Surgery

## 2021-01-05 DIAGNOSIS — D170 Benign lipomatous neoplasm of skin and subcutaneous tissue of head, face and neck: Secondary | ICD-10-CM | POA: Diagnosis not present

## 2021-01-05 NOTE — Progress Notes (Signed)
Reviewed w pt and her daughter how to care for her JP drain. Gave pt supplies, sheet to record amt and all questions answered. They both verbalized understanding. Reviewed written d/c instructions w pt and other questions answered. D/C per w/c w all belongings in stable condition.

## 2021-01-05 NOTE — Discharge Summary (Signed)
Physician Discharge Summary  Patient ID: Sarah Fuentes MRN: [id number] DOB/AGE: 1951/01/12 70 y.o.  Admit date: 01/04/2021 Discharge date: 01/05/2021  Admission Diagnoses: Excision right neck mass  Discharge Diagnoses:  Principal Problem:   Mass of right side of neck Active Problems:   Soft tissue mass   Discharged Condition: good  Hospital Course: Patient did well.  Pain was well controlled.  JP drainage from her neck was serous.  She had no difficulty swallowing, speaking or breathing.  She was discharged home on postop day 1 in good condition    Treatments: surgery: Excision right neck mass  Discharge Exam: Blood pressure (!) 127/58, pulse 80, temperature 98.1 F (36.7 C), temperature source Oral, resp. rate 16, height 5\' 1"  (1.549 m), weight 91.4 kg, SpO2 98 %. General appearance: alert and cooperative Neck: Neck incision clean dry intact.  No hematoma.  JP drain noted serosanguineous Resp: clear to auscultation bilaterally Cardio: regular rate and rhythm Incision/Wound: Incision clean dry intact  Disposition: Discharge disposition: 01-Home or Self Care       Discharge Instructions     Diet - low sodium heart healthy   Complete by: As directed    Increase activity slowly   Complete by: As directed       Allergies as of 01/05/2021       Reactions   Iodine Anaphylaxis   Keflex [cephalexin] Anaphylaxis   Penicillins Anaphylaxis, Hives   Has patient had a PCN reaction causing immediate rash, facial/tongue/throat swelling, SOB or lightheadedness with hypotension: Yes Has patient had a PCN reaction causing severe rash involving mucus membranes or skin necrosis: Yes Has patient had a PCN reaction that required hospitalization: No Has patient had a PCN reaction occurring within the last 10 years: No If all of the above answers are "NO", then may proceed with Cephalosporin use.   Raspberry Anaphylaxis   Strawberry Extract Anaphylaxis   Sulfa Antibiotics  Anaphylaxis, Hives   Amlodipine Hives   Atenolol Hives   Iohexol Hives   Pt states years ago she had several IVP's and became allergic by breaking out in hives.  She since has had benadryl prior to "new" contrast has  done ok.   Levaquin [levofloxacin In D5w]    Made her really sick States she "feels like i'm having a heart attack"   Other Itching, Swelling   Preservatives used in eyedrops   Sulfites    Affects her breathing   Tape    Red and inflamed   Tetanus Toxoids Swelling   Arm swelling   Toprol Xl [metoprolol Tartrate] Hives        Medication List     TAKE these medications    ALPRAZolam 0.25 MG tablet Commonly known as: XANAX Take 0.25 mg by mouth 2 (two) times daily as needed for anxiety.   Bystolic 10 MG tablet Generic drug: nebivolol Take 10 mg by mouth in the morning, at noon, and at bedtime.   cyclobenzaprine 10 MG tablet Commonly known as: FLEXERIL Take 10 mg by mouth 3 (three) times daily as needed for muscle spasms.   dexlansoprazole 60 MG capsule Commonly known as: DEXILANT Take 60 mg by mouth daily.   diphenhydrAMINE 25 MG tablet Commonly known as: BENADRYL Take 25 mg by mouth every 6 (six) hours as needed for itching or allergies.   furosemide 40 MG tablet Commonly known as: LASIX Take 40 mg by mouth daily.   hydrALAZINE 50 MG tablet Commonly known as: APRESOLINE Take 50 mg by  mouth 3 (three) times daily.   ibuprofen 200 MG tablet Commonly known as: ADVIL Take 200 mg by mouth every 6 (six) hours as needed (knee pain).   Livalo 2 MG Tabs Generic drug: Pitavastatin Calcium Take by mouth daily.   metFORMIN 500 MG tablet Commonly known as: GLUCOPHAGE Take 500 mg by mouth at bedtime.   ondansetron 4 MG tablet Commonly known as: ZOFRAN Take 1 tablet (4 mg total) by mouth every 6 (six) hours.   oxyCODONE-acetaminophen 5-325 MG tablet Commonly known as: PERCOCET/ROXICET Take 2 tablets by mouth every 6 (six) hours as needed for severe  pain.   promethazine 12.5 MG tablet Commonly known as: PHENERGAN Take 1 tablet (12.5 mg total) by mouth every 6 (six) hours as needed for nausea or vomiting. What changed: how much to take   traMADol 50 MG tablet Commonly known as: Ultram Take 1-2 tablets (50-100 mg total) by mouth every 6 (six) hours as needed for moderate pain.   valsartan 320 MG tablet Commonly known as: DIOVAN Take 320 mg by mouth daily.   Zioptan 0.0015 % Soln Generic drug: Tafluprost (PF) Place 1 drop into both eyes at bedtime.        Follow-up Information     Armandina Gemma, MD. Schedule an appointment as soon as possible for a visit in 10 day(s).   Specialty: General Surgery Why: For wound re-check Contact information: [place] [address] [phone]                 Signed: Joyice Faster Abshir Paolini 01/05/2021, 10:50 AM

## 2021-01-07 LAB — SURGICAL PATHOLOGY

## 2021-01-07 NOTE — Progress Notes (Signed)
Please contact patient and notify of benign pathology results.  Latana Colin M. Mycah Formica, MD, FACS [facility] Office: [phone]   

## 2021-09-24 ENCOUNTER — Ambulatory Visit: Payer: Medicare Other | Admitting: Podiatry

## 2021-09-24 ENCOUNTER — Other Ambulatory Visit: Payer: Self-pay

## 2021-09-24 DIAGNOSIS — L84 Corns and callosities: Secondary | ICD-10-CM

## 2021-09-24 DIAGNOSIS — M79671 Pain in right foot: Secondary | ICD-10-CM

## 2021-09-24 DIAGNOSIS — M216X9 Other acquired deformities of unspecified foot: Secondary | ICD-10-CM

## 2021-09-24 DIAGNOSIS — M79672 Pain in left foot: Secondary | ICD-10-CM

## 2021-09-24 NOTE — Progress Notes (Signed)
Subjective:  ? ?Patient ID: Sarah Fuentes, female   DOB: 71 y.o.   MRN: [id number]  ? ?HPI ?71 year old female presents the office today for concerns of calluses on the bottoms of her feet which has been ongoing for about a year now causing discomfort.  She has a right knee problem which she tends to put more pressure on her left side which may be aggravating her symptoms as well she reports.  No recent injuries.  No recent treatment.  No increase swelling or redness or drainage or signs of infection as she reports. ? ?Last A1c was 5.9 and last blood sugar she reports was 129 ? ? ?Review of Systems  ?All other systems reviewed and are negative. ? ?Past Medical History:  ?Diagnosis Date  ? Anemia   ? Anxiety   ? takes Xanax daily as needed  ? Arthritis   ? Back spasm   ? takes Flexeril daily as needed  ? Cataract   ? left eye and immature  ? Depression   ? but doesn't take any meds  ? Diabetes mellitus without complication ([place])   ? borderline but no meds required   ? Diarrhea   ? takes Pepto every 6hrs as needed  ? Dyspnea   ? with exertion  ? Dysrhythmia   ? left bundle branch block-this goes all the way back to the early 80's  ? Eosinophilic esophagitis   ? Fatty tumor   ? on right side of neck;states its been there for a long time  ? GERD (gastroesophageal reflux disease)   ? takes Dexilant daily  ? Hashimoto's disease   ? Headache(784.0)   ? History of colon polyps   ? Hyperlipidemia   ? but not on any meds  ? Hypertension   ? takes Bystolic and Diovan daily  ? Joint pain   ? Joint swelling   ? Other specified disorders of adrenal glands   ? small lesions  on adrenal gland  ? Peripheral edema   ? takes Lasix daily  ? Pneumonia   ? as a child  ? PONV (postoperative nausea and vomiting)   ? Thyroid disease   ? Urinary frequency   ? Vertigo   ? takes Meclizine daily  ? ? ?Past Surgical History:  ?Procedure Laterality Date  ? BIOPSY THYROID    ? CARDIAC CATHETERIZATION  late 90's  ? CHOLECYSTECTOMY N/A 11/01/2013  ?  Procedure: LAPAROSCOPIC CHOLECYSTECTOMY;  Surgeon: Gwenyth Ober, MD;  Location: [facility];  Service: General;  Laterality: N/A;  ? COLONOSCOPY    ? cyst drained from leg Left   ? DENTAL SURGERY    ? numerous times  ? DIAGNOSTIC LAPAROSCOPY    ? d/t endometriosis;this was done multiple times  ? ESOPHAGOGASTRODUODENOSCOPY    ? EXCISION MASS NECK Right 01/04/2021  ? Procedure: EXCISION SOFT TISSUE MASS RIGHT NECK;  Surgeon: Armandina Gemma, MD;  Location: [facility];  Service: General;  Laterality: Right;  ? [place]  ? reconstruction done to right ureter    ? right ureter ruptured  1988  ? stent was placed   ? TONSILLECTOMY AND ADENOIDECTOMY  1955  ? ureter stent removed    ? VAGINAL HYSTERECTOMY    ? WISDOM TOOTH EXTRACTION    ? ? ? ?Current Outpatient Medications:  ?  ALPRAZolam (XANAX) 0.25 MG tablet, Take 0.25 mg by mouth 2 (two) times daily as needed for anxiety. , Disp: , Rfl:  ?  BYSTOLIC 10 MG tablet, Take 10 mg by mouth in the morning, at noon, and at bedtime., Disp: , Rfl:  ?  cyclobenzaprine (FLEXERIL) 10 MG tablet, Take 10 mg by mouth 3 (three) times daily as needed for muscle spasms., Disp: , Rfl:  ?  dexlansoprazole (DEXILANT) 60 MG capsule, Take 60 mg by mouth daily., Disp: , Rfl:  ?  diphenhydrAMINE (BENADRYL) 25 MG tablet, Take 25 mg by mouth every 6 (six) hours as needed for itching or allergies., Disp: , Rfl:  ?  furosemide (LASIX) 40 MG tablet, Take 40 mg by mouth daily. , Disp: , Rfl:  ?  hydrALAZINE (APRESOLINE) 50 MG tablet, Take 50 mg by mouth 3 (three) times daily., Disp: , Rfl:  ?  ibuprofen (ADVIL,MOTRIN) 200 MG tablet, Take 200 mg by mouth every 6 (six) hours as needed (knee pain)., Disp: , Rfl:  ?  metFORMIN (GLUCOPHAGE) 500 MG tablet, Take 500 mg by mouth at bedtime., Disp: , Rfl:  ?  ondansetron (ZOFRAN) 4 MG tablet, Take 1 tablet (4 mg total) by mouth every 6 (six) hours., Disp: 12 tablet, Rfl: 0 ?  oxyCODONE-acetaminophen (PERCOCET/ROXICET) 5-325 MG tablet,  Take 2 tablets by mouth every 6 (six) hours as needed for severe pain., Disp: 15 tablet, Rfl: 0 ?  Pitavastatin Calcium (LIVALO) 2 MG TABS, Take by mouth daily., Disp: , Rfl:  ?  promethazine (PHENERGAN) 12.5 MG tablet, Take 1 tablet (12.5 mg total) by mouth every 6 (six) hours as needed for nausea or vomiting., Disp: 30 tablet, Rfl: 0 ?  Tafluprost, PF, (ZIOPTAN) 0.0015 % SOLN, Place 1 drop into both eyes at bedtime., Disp: , Rfl:  ?  traMADol (ULTRAM) 50 MG tablet, Take 1-2 tablets (50-100 mg total) by mouth every 6 (six) hours as needed for moderate pain., Disp: 20 tablet, Rfl: 0 ?  valsartan (DIOVAN) 320 MG tablet, Take 320 mg by mouth daily. , Disp: , Rfl:  ? ?Allergies  ?Allergen Reactions  ? Iodine Anaphylaxis  ? Keflex [Cephalexin] Anaphylaxis  ? Penicillins Anaphylaxis and Hives  ?  Has patient had a PCN reaction causing immediate rash, facial/tongue/throat swelling, SOB or lightheadedness with hypotension: Yes ?Has patient had a PCN reaction causing severe rash involving mucus membranes or skin necrosis: Yes ?Has patient had a PCN reaction that required hospitalization: No ?Has patient had a PCN reaction occurring within the last 10 years: No ?If all of the above answers are "NO", then may proceed with Cephalosporin use. ?  ? Raspberry Anaphylaxis  ? Strawberry Extract Anaphylaxis  ? Sulfa Antibiotics Anaphylaxis and Hives  ? Amlodipine Hives  ? Atenolol Hives  ? Iohexol Hives  ?  Pt states years ago she had several IVP's and became allergic by breaking out in hives.  She since has had benadryl prior to "new" contrast has  done ok. ?  ? Levaquin [Levofloxacin In D5w]   ?  Made her really sick ?States she "feels like i'm having a heart attack"  ? Other Itching and Swelling  ?  Preservatives used in eyedrops  ? Sulfites   ?  Affects her breathing  ? Tape   ?  Red and inflamed  ? Tetanus Toxoids Swelling  ?  Arm swelling  ? Toprol Xl [Metoprolol Tartrate] Hives  ? ? ? ? ? ?   ?Objective:  ?Physical Exam   ?General: AAO x3, NAD ? ?Dermatological: Hyperkeratotic lesions noted submetatarsal 5 bilaterally.  There is no ongoing ulceration drainage or signs of infection  with left side worse than right.  No open sores. ? ?Vascular: Dorsalis Pedis artery and Posterior Tibial artery pedal pulses are 2/4 bilateral with immedate capillary fill time.  There is no pain with calf compression, swelling, warmth, erythema.  ? ?Neruologic: Grossly intact via light touch bilateral.  ? ?Musculoskeletal: Prominence of metatarsal head plantarly.  Muscular strength 5/5 in all groups tested bilateral. ? ?Gait: Unassisted, Nonantalgic.  ? ? ?   ?Assessment:  ? ?Hyperkeratotic lesions, prominent metatarsal head ? ?   ?Plan:  ?Sharply debrided the hyperkeratotic lesions x2 without any complications or bleeding.  Recommend moisturizer and offloading.  Dispensed offloading pads.  Discussed that if it inspection, continued with glucose control. ? ?Trula Slade DPM ? ?   ? ?

## 2021-09-24 NOTE — Patient Instructions (Signed)

## 2021-11-18 ENCOUNTER — Other Ambulatory Visit: Payer: Self-pay | Admitting: Surgery

## 2021-11-18 DIAGNOSIS — E041 Nontoxic single thyroid nodule: Secondary | ICD-10-CM

## 2021-12-17 ENCOUNTER — Ambulatory Visit
Admission: RE | Admit: 2021-12-17 | Discharge: 2021-12-17 | Disposition: A | Discharge status: Home / Self Care | Payer: Medicare Other | Source: Ambulatory Visit | Attending: Surgery | Admitting: Surgery

## 2021-12-17 DIAGNOSIS — E041 Nontoxic single thyroid nodule: Secondary | ICD-10-CM

## 2022-02-05 ENCOUNTER — Other Ambulatory Visit: Payer: Self-pay | Admitting: *Deleted

## 2022-02-05 DIAGNOSIS — R0989 Other specified symptoms and signs involving the circulatory and respiratory systems: Secondary | ICD-10-CM

## 2022-02-12 ENCOUNTER — Telehealth: Payer: Self-pay

## 2022-02-12 NOTE — Telephone Encounter (Signed)
Pt called stating that she has an upcoming appt with [place] on 8/3 and wanted to make sure her insurance would cover it. She had an [place] in the past for her thyroid and it was $400+ OOP.   Reviewed pt's chart, returned pt's call, two identifiers used. Used the [employer] which showed $0 OOP. Shared this info with pt and advised her to call her insurance company to confirm the coverage. Confirmed understanding.

## 2022-02-19 ENCOUNTER — Telehealth: Payer: Self-pay

## 2022-02-20 ENCOUNTER — Encounter: Payer: Self-pay | Admitting: Vascular Surgery

## 2022-02-20 ENCOUNTER — Ambulatory Visit (HOSPITAL_COMMUNITY)
Admission: RE | Admit: 2022-02-20 | Discharge: 2022-02-20 | Disposition: A | Discharge status: Home / Self Care | Payer: Medicare Other | Source: Ambulatory Visit | Attending: Vascular Surgery | Admitting: Vascular Surgery

## 2022-02-20 ENCOUNTER — Ambulatory Visit: Payer: Medicare Other | Admitting: Vascular Surgery

## 2022-02-20 VITALS — BP 164/93 | HR 60 | Temp 98.5°F | Resp 20 | Ht 61.0 in | Wt 202.0 lb

## 2022-02-20 DIAGNOSIS — I6523 Occlusion and stenosis of bilateral carotid arteries: Secondary | ICD-10-CM | POA: Diagnosis not present

## 2022-02-20 DIAGNOSIS — R0989 Other specified symptoms and signs involving the circulatory and respiratory systems: Secondary | ICD-10-CM

## 2022-02-20 NOTE — Progress Notes (Signed)
ASSESSMENT & PLAN   ASYMPTOMATIC BILATERAL GREATER THAN 80% CAROTID STENOSES: This patient has asymptomatic bilateral greater than 80% carotid stenoses.  The stenosis on the right extends fairly high and to be sure that this is surgically accessible I have recommended that we obtain a CT angio of the head and neck.  She has a dye allergy and she will definitely have to be premedicated for this.  If the stenosis is not surgically accessible she could be considered for a TCAR.  I explained that once the stenosis has progressed to greater than 80% the risk of stroke increases and we would recommend addressing the carotid stenosis in order to lower her risk of future stroke.  I have encouraged her to begin taking 81 mg of aspirin daily.  She is on a statin.  If she were to have to be considered for TCAR she would have to be started on Plavix.  I will see her back after her CT angiogram to make further recommendations.  REASON FOR CONSULT:    Bilateral carotid disease.  The consult is requested by Dr. Janie Morning.  HPI:   Sarah Fuentes is a 71 y.o. female who was found to have a left carotid bruit.  This prompted a carotid duplex scan which showed evidence of a greater than 70% stenosis bilaterally.  The patient was referred for vascular consultation.  On my history, the patient had fallen and had x-rays of her neck which showed "something suspicious."  Perhaps they saw some calcific plaque.  Regardless this prompted a carotid duplex scan which showed bilateral carotid disease that was significant.  She is right-handed.  She denies any history of stroke, TIAs, expressive or receptive aphasia, or amaurosis fugax.  She does not like to take aspirin but tells me that she is not allergic.  She apparently does have a problem with enteric-coated aspirin but can take regular baby aspirin.  She is on a statin (pitavastatin).   She has multiple allergies (>10) including a dye allergy.  She denies any  history of myocardial infarction or history of congestive heart failure.  She has had a recent chest pain.  She tells me she has had a lipoma resected from her right supraclavicular area.    She also tells me that she has a small airway.  Past Medical History:  Diagnosis Date   Anemia    Anxiety    takes Xanax daily as needed   Arthritis    Back spasm    takes Flexeril daily as needed   Cataract    left eye and immature   Depression    but doesn't take any meds   Diabetes mellitus without complication ([facility])    borderline but no meds required    Diarrhea    takes Pepto every 6hrs as needed   Dyspnea    with exertion   Dysrhythmia    left bundle branch block-this goes all the way back to the early 38'V   Eosinophilic esophagitis    Fatty tumor    on right side of neck;states its been there for a long time   GERD (gastroesophageal reflux disease)    takes Dexilant daily   Hashimoto's disease    Headache(784.0)    History of colon polyps    Hyperlipidemia    but not on any meds   Hypertension    takes Bystolic and Diovan daily   Joint pain    Joint swelling    Other  specified disorders of adrenal glands    small lesions  on adrenal gland   Peripheral edema    takes Lasix daily   Pneumonia    as a child   PONV (postoperative nausea and vomiting)    Thyroid disease    Urinary frequency    Vertigo    takes Meclizine daily    Family History  Problem Relation Age of Onset   Cancer Mother    Kidney disease Father     SOCIAL HISTORY: Social History   Tobacco Use   Smoking status: Never   Smokeless tobacco: Never  Substance Use Topics   Alcohol use: No    Allergies  Allergen Reactions   Iodine Anaphylaxis   Keflex [Cephalexin] Anaphylaxis   Penicillins Anaphylaxis and Hives    Has patient had a PCN reaction causing immediate rash, facial/tongue/throat swelling, SOB or lightheadedness with hypotension: Yes Has patient had a PCN reaction causing severe  rash involving mucus membranes or skin necrosis: Yes Has patient had a PCN reaction that required hospitalization: No Has patient had a PCN reaction occurring within the last 10 years: No If all of the above answers are "NO", then may proceed with Cephalosporin use.    Raspberry Anaphylaxis   Strawberry Extract Anaphylaxis   Sulfa Antibiotics Anaphylaxis and Hives   Amlodipine Hives   Atenolol Hives   Clonidine Hcl     Other reaction(s): rash   Empagliflozin     Other reaction(s): facial swelling   Erythromycin     Other reaction(s): GI upset   Fluoxetine     Other reaction(s): headaches   Influenza Vaccines Nausea Only   Iodinated Contrast Media     Other reaction(s): hives   Iohexol Hives    Pt states years ago she had several IVP's and became allergic by breaking out in hives.  She since has had benadryl prior to "new" contrast has  done ok.    Levaquin [Levofloxacin In D5w]     Made her really sick States she "feels like i'm having a heart attack"   Macrobid [Nitrofurantoin]     Other reaction(s): facial swelling   Metaproterenol     Other reaction(s): increased B/P, dyspnea   Metformin Hcl     Other reaction(s): diarrhea   Metronidazole     Other reaction(s): increased B/P and mild swelling   Naproxen     Other reaction(s): nervousness   Other Itching and Swelling    Preservatives used in eyedrops   Ramipril     Other reaction(s): hives, itching   Rofecoxib     Other reaction(s): stomach pain   Rosuvastatin     Other reaction(s): myalgia   Strawberry (Diagnostic)     Other reaction(s): anaphylaxis   Sulfamethoxazole-Trimethoprim     Other reaction(s): eye swelling   Sulfites     Affects her breathing   Tape     Red and inflamed   Tetanus Antitoxin     Other reaction(s): hives   Tetanus Toxoid Swelling    Arm swelling   Tetanus Toxoids Swelling    Arm swelling   Toprol Xl [Metoprolol Tartrate] Hives   Welchol [Colesevelam]     Other reaction(s): facial  swelling   Silicone Rash    Red and inflamed    Current Outpatient Medications  Medication Sig Dispense Refill   ALPRAZolam (XANAX) 0.25 MG tablet Take 0.25 mg by mouth 2 (two) times daily as needed for anxiety.      BYSTOLIC 10  MG tablet Take 10 mg by mouth in the morning, at noon, and at bedtime.     cyclobenzaprine (FLEXERIL) 10 MG tablet Take 10 mg by mouth 3 (three) times daily as needed for muscle spasms.     dexlansoprazole (DEXILANT) 60 MG capsule Take 60 mg by mouth daily.     diphenhydrAMINE (BENADRYL) 25 MG tablet Take 25 mg by mouth every 6 (six) hours as needed for itching or allergies.     EPINEPHrine 0.3 mg/0.3 mL IJ SOAJ injection SMARTSIG:injection IM As Directed     furosemide (LASIX) 40 MG tablet Take 40 mg by mouth daily.      hydrALAZINE (APRESOLINE) 50 MG tablet Take 50 mg by mouth 3 (three) times daily.     ibuprofen (ADVIL,MOTRIN) 200 MG tablet Take 200 mg by mouth every 6 (six) hours as needed (knee pain).     metFORMIN (GLUCOPHAGE) 500 MG tablet Take 500 mg by mouth at bedtime.     Pitavastatin Calcium (LIVALO) 2 MG TABS Take by mouth daily.     promethazine (PHENERGAN) 12.5 MG tablet Take 1 tablet (12.5 mg total) by mouth every 6 (six) hours as needed for nausea or vomiting. 30 tablet 0   Tafluprost, PF, (ZIOPTAN) 0.0015 % SOLN Place 1 drop into both eyes at bedtime.     valsartan (DIOVAN) 320 MG tablet Take 320 mg by mouth daily.      No current facility-administered medications for this visit.    REVIEW OF SYSTEMS:  '[X]'$  denotes positive finding, '[ ]'$  denotes negative finding Cardiac  Comments:  Chest pain or chest pressure:    Shortness of breath upon exertion:    Short of breath when lying flat:    Irregular heart rhythm:        Vascular    Pain in calf, thigh, or hip brought on by ambulation:    Pain in feet at night that wakes you up from your sleep:     Blood clot in your veins:    Leg swelling:         Pulmonary    Oxygen at home:     Productive cough:     Wheezing:         Neurologic    Sudden weakness in arms or legs:     Sudden numbness in arms or legs:     Sudden onset of difficulty speaking or slurred speech:    Temporary loss of vision in one eye:     Problems with dizziness:         Gastrointestinal    Blood in stool:     Vomited blood:         Genitourinary    Burning when urinating:     Blood in urine:        Psychiatric    Major depression:         Hematologic    Bleeding problems:    Problems with blood clotting too easily:        Skin    Rashes or ulcers:        Constitutional    Fever or chills:    -  PHYSICAL EXAM:   Vitals:   02/20/22 1359 02/20/22 1402  BP: (!) 154/63 (!) 164/93  Pulse: 60   Resp: 20   Temp: 98.5 F (36.9 C)   SpO2: 98%   Weight: 202 lb (91.6 kg)   Height: '5\' 1"'$  (1.549 m)    Body mass index is  38.17 kg/m. GENERAL: The patient is a well-nourished female, in no acute distress. The vital signs are documented above. CARDIAC: There is a regular rate and rhythm.  VASCULAR: She has a left carotid bruit. She has palpable dorsalis pedis pulses. PULMONARY: There is good air exchange bilaterally without wheezing or rales. ABDOMEN: Soft and non-tender with normal pitched bowel sounds.  MUSCULOSKELETAL: There are no major deformities. NEUROLOGIC: No focal weakness or paresthesias are detected. SKIN: There are no ulcers or rashes noted. PSYCHIATRIC: The patient has a normal affect.  DATA:    CAROTID DUPLEX: I did review her carotid duplex scan that was done elsewhere which shows a greater than 70% carotid stenosis bilaterally.  CAROTID DUPLEX: I have independently interpreted the carotid duplex scan in our office which shows a greater than 80% carotid stenosis bilaterally.  Both vertebral arteries are patent with antegrade flow.  On the right side the plaque extends greater than 2 cm at the ICA.  This reason I am want to be sure that this is surgically  accessible.  Deitra Mayo [facility]

## 2022-03-10 ENCOUNTER — Other Ambulatory Visit: Payer: Self-pay

## 2022-03-10 DIAGNOSIS — I6523 Occlusion and stenosis of bilateral carotid arteries: Secondary | ICD-10-CM

## 2022-03-10 NOTE — Telephone Encounter (Signed)
I spoke with Jasmine at [facility] to provide verbal:  Prednisone '50mg'$  SIG: Take as directed prior to CTA Scan 1 tablet PO 13 Hours Prior to Procedure 1 tablet PO 7 Hours Prior to procedure 1 tablet PO 1 Hour Prior to procedure QNTY: 3 tablets Refills: 0 Provider: Deitra Mayo  Benadryl '50mg'$  SIG: Take 1 tablet 1 hour prior to procedure QNTY: 1 Refill: 0 Provider: Deitra Mayo

## 2022-03-11 ENCOUNTER — Telehealth: Payer: Self-pay

## 2022-03-11 NOTE — Telephone Encounter (Signed)
Pt called stating that she needed her CT pre-meds sent to the [facility] pharmacy.  Reviewed pt's chart, called pharmacy, confirmed receipt of verbal order and they have filled it. Ready for pt pu.  Called pt, two identifiers used. Informed pt that her prescription is ready for pu. She could discuss the Benadryl since it is an OTC med and it may be cheaper to use her own supply. Confirmed understanding.

## 2022-03-20 ENCOUNTER — Telehealth: Payer: Self-pay

## 2022-03-20 NOTE — Telephone Encounter (Signed)
Pt called stating that she needed clarification on the IV dye contrast allergy med protocol, specifically the Benadryl.  Reviewed pt's chart, returned call for clarification, two identifiers used. Pt understood dosage and when to take Prednisone. She was unsure of the Benadryl. Instructed her to take 50 mg with her last dose of Prednisone 1 hour prior to procedure. Confirmed understanding.

## 2022-03-25 ENCOUNTER — Telehealth: Payer: Self-pay

## 2022-03-25 ENCOUNTER — Ambulatory Visit (HOSPITAL_BASED_OUTPATIENT_CLINIC_OR_DEPARTMENT_OTHER): Admission: RE | Admit: 2022-03-25 | Payer: Medicare Other | Source: Ambulatory Visit

## 2022-03-25 ENCOUNTER — Ambulatory Visit: Payer: Self-pay | Admitting: *Deleted

## 2022-03-25 NOTE — Telephone Encounter (Signed)
Pt called stating that she had a CT with contrast scheduled for today at [place]. Since she has an allergy to the dye, she took Prednisone. After the first dose, she noticed that her BG was increasing. She said, "I am not going into a diabetic coma for this test."  BG readings: 0800     170 1000     231 1100     210 1200     187  Reviewed pt's chart, returned call for clarification, two identifiers used. Informed her that it was normal for prednisone to increase BG and tried to reassure her that she would not go into a diabetic coma, but she adamantly refused. She stated that no one told her the prednisone would affect her diabetes. Informed her that I would talk to Dr Scot Dock for his advice.  Sent Dickson a page, OR RN responded that he was in a case, and he would call back.  Pt called twice more.  Dr Scot Dock called and said to cancel CT and schedule appt for pt to discuss options.  Called pt to inform her, but she had already canceled the CT and she stated she would keep the appt she already had for 9/21. Confirmed understanding.

## 2022-03-25 NOTE — Telephone Encounter (Signed)
  Chief Complaint: elevated glucose Symptoms: prednisone SE- elevated glucose Frequency: 1 day Pertinent Negatives: Patient denies fever, frequent urination, difficulty breathing, dizziness, weakness, vomiting Disposition: '[]'$ ED /'[]'$ Urgent Care (no appt availability in office) / '[]'$ Appointment(In office/virtual)/ '[]'$  [facility]/ '[x]'$ Home Care/ '[]'$ Refused Recommended Disposition /'[]'$ [facility]/ '[]'$  Follow-up with PCP Additional Notes:  Advised follow up PCP

## 2022-03-25 NOTE — Telephone Encounter (Signed)
Summary: blood sugar concern   The patient has called on the community line to share that at 12:02 their blood sugar was 187   The patient is experiencing no symptoms at the time of call but would like to speak with a member of staff further when possible      Reason for Disposition  Blood glucose 70-240 mg/dL (3.9 -13.3 mmol/L)  Answer Assessment - Initial Assessment Questions 1. BLOOD GLUCOSE: "What is your blood glucose level?"      170 fasting- now 169 2. ONSET: "When did you check the blood glucose?"     fasting 3. USUAL RANGE: "What is your glucose level usually?" (e.g., usual fasting morning value, usual evening value)     Never has been this high 4. KETONES: "Do you check for ketones (urine or blood test strips)?" If Yes, ask: "What does the test show now?"      *No Answer* 5. TYPE 1 or 2:  "Do you know what type of diabetes you have?"  (e.g., Type 1, Type 2, Gestational; doesn't know)      Type 2 6. INSULIN: "Do you take insulin?" "What type of insulin(s) do you use? What is the mode of delivery? (syringe, pen; injection or pump)?"      no 7. DIABETES PILLS: "Do you take any pills for your diabetes?" If Yes, ask: "Have you missed taking any pills recently?"     Metformin in am 8. OTHER SYMPTOMS: "Do you have any symptoms?" (e.g., fever, frequent urination, difficulty breathing, dizziness, weakness, vomiting)     none  Glucose: Patient has scheduled CT scan and was given prednisone to take pre procedure. Patient has not taken Prednisone since she was diagnosed and was shocked at how high it made her glucose.  4am Prednisone dose Fasting glucose 170 8:39 Metformin dose with breakfast 8:27 231 11:00 210 12:00 187 1:00 169 Patient advised common reaction to prednisone- advised increased water intake, eat lunch- do not skip meal- high protein meal Call PCP for follow up and always speak to PCP if has to take prednisone in future for level perimeters  Protocols used: Diabetes  - High Blood Sugar-A-AH

## 2022-04-10 ENCOUNTER — Ambulatory Visit: Payer: Medicare Other | Admitting: Vascular Surgery

## 2022-04-10 ENCOUNTER — Encounter: Payer: Self-pay | Admitting: Vascular Surgery

## 2022-04-10 VITALS — BP 137/66 | HR 61 | Temp 98.2°F | Resp 20 | Ht 61.0 in | Wt 201.3 lb

## 2022-04-10 DIAGNOSIS — I6523 Occlusion and stenosis of bilateral carotid arteries: Secondary | ICD-10-CM | POA: Diagnosis not present

## 2022-04-10 NOTE — Progress Notes (Signed)
Patient name: Sarah Fuentes MRN: [id number] DOB: Mar 30, 1951 Sex: female  REASON FOR VISIT:   Follow-up of bilateral carotid disease.  HPI:   Sarah Fuentes is a pleasant 71 y.o. female who I saw on 02/20/2022 with bilateral carotid disease.  She was found to have a left carotid bruit.  This prompted a carotid duplex scan which showed greater than 70% stenoses bilaterally.  Duplex scan in our office confirmed greater than 80% carotid stenoses bilaterally.  The stenosis on the right was felt to be somewhat high and for this reason I recommended a CT angiogram to be sure that this was surgically accessible.  She took prednisone and this resulted in a very high blood sugar and therefore a CT angiogram was not performed.  She is also very concerned about getting premedicated.  I given that she has a dye allergy.  She comes in today to discuss other options.  Since I saw her last she denies any focal weakness or paresthesias.  She denies any expressive or receptive aphasia or amaurosis fugax.  She is taking 81 mg of aspirin daily.  She is also on a statin (Pitavastatin).  She does tell me that she has a small airway.  Current Outpatient Medications  Medication Sig Dispense Refill   ALPRAZolam (XANAX) 0.25 MG tablet Take 0.25 mg by mouth 2 (two) times daily as needed for anxiety.      BYSTOLIC 10 MG tablet Take 10 mg by mouth in the morning, at noon, and at bedtime.     cyclobenzaprine (FLEXERIL) 10 MG tablet Take 10 mg by mouth 3 (three) times daily as needed for muscle spasms.     dexlansoprazole (DEXILANT) 60 MG capsule Take 60 mg by mouth daily.     diphenhydrAMINE (BENADRYL) 25 MG tablet Take 25 mg by mouth every 6 (six) hours as needed for itching or allergies.     EPINEPHrine 0.3 mg/0.3 mL IJ SOAJ injection SMARTSIG:injection IM As Directed     furosemide (LASIX) 40 MG tablet Take 40 mg by mouth daily.      hydrALAZINE (APRESOLINE) 50 MG tablet Take 50 mg by mouth 3 (three) times daily.      ibuprofen (ADVIL,MOTRIN) 200 MG tablet Take 200 mg by mouth every 6 (six) hours as needed (knee pain).     metFORMIN (GLUCOPHAGE) 500 MG tablet Take 500 mg by mouth at bedtime.     Pitavastatin Calcium (LIVALO) 2 MG TABS Take by mouth daily.     predniSONE (DELTASONE) 50 MG tablet Take 50 mg by mouth. 1 tablet PO 13 Hours Prior to Procedure 1 tablet PO 7 Hours Prior to procedure 1 tablet PO 1 Hour Prior to procedure (Patient not taking: Reported on 04/10/2022)     promethazine (PHENERGAN) 12.5 MG tablet Take 1 tablet (12.5 mg total) by mouth every 6 (six) hours as needed for nausea or vomiting. 30 tablet 0   Tafluprost, PF, (ZIOPTAN) 0.0015 % SOLN Place 1 drop into both eyes at bedtime.     valsartan (DIOVAN) 320 MG tablet Take 320 mg by mouth daily.      No current facility-administered medications for this visit.    REVIEW OF SYSTEMS:  '[X]'$  denotes positive finding, '[ ]'$  denotes negative finding Vascular    Leg swelling    Cardiac    Chest pain or chest pressure:    Shortness of breath upon exertion:    Short of breath when lying flat:    Irregular heart rhythm:  Constitutional    Fever or chills:     PHYSICAL EXAM:   Vitals:   04/10/22 0858 04/10/22 0901  BP: (!) 161/69 137/66  Pulse: 61   Resp: 20   Temp: 98.2 F (36.8 C)   SpO2: 95%   Weight: 201 lb 4.8 oz (91.3 kg)   Height: '5\' 1"'$  (1.549 m)     GENERAL: The patient is a well-nourished female, in no acute distress. The vital signs are documented above. CARDIOVASCULAR: There is a regular rate and rhythm. PULMONARY: There is good air exchange bilaterally without wheezing or rales. VASCULAR: She has a left carotid bruit.  DATA:   No new data  MEDICAL ISSUES:   GREATER THAN 80% BILATERAL CAROTID STENOSES: This patient has greater than 80% bilateral carotid stenoses which are asymptomatic.  She is on aspirin and is on a statin.  I was somewhat concerned that the carotid stenosis on the right was somewhat high and the  plan on getting a CT angiogram but this did not occur because her blood sugar went quite high after being premedicated with prednisone.  She also has a dye allergy.  All things considered I have recommended that we proceed with left carotid endarterectomy.  Once she has recovered from this I think the best option would be stage right carotid endarterectomy with the understanding that there is a small chance that the stenosis extends to high.  However after reviewing the duplex again I think we could probably get above the plaque.  We have again discussed the indications for the procedure and the potential complications including but not limited to stroke (perioperative risk 1 to 2%), MI, bleeding, or other unpredictable medical problems.  Currently she has some issues to work out before scheduling her surgery.  She is we hear from her we will schedule her for a left carotid endarterectomy with plans for staged right carotid endarterectomy when she has recovered from that.  She had considered waiting till the first of next year.  I explained that I think that would be too far out given the risk for stroke.  Deitra Mayo [facility] [phone]

## 2022-04-17 IMAGING — CT CT NECK W/O CM
3 series · 8 of 14 positions shown, 9 images · non-contrast
Comparison: Thyroid ultrasound 03/13/2011. Report from neck CT
12/29/2006 (images unavailable).

CLINICAL DATA: Mass of right-side of neck. Supraclavicular mass.
Additional history provided by scanning technologist: Patient
reports right-sided neck mass extending to right clavicular area,
mass has grown in size over the last 10 years.

EXAM:
CT NECK WITHOUT CONTRAST
TECHNIQUE: Multidetector CT imaging of the neck was performed following the
standard protocol without intravenous contrast.

[Series 3: axial neck · axial · 0.61mm/px · z∈[-468,-396]mm · 2 of 110 slices shown]
[im 37/110  bone]
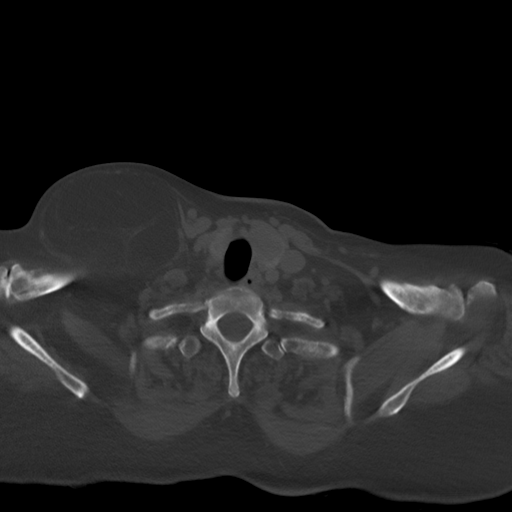
[im 73/110  bone]
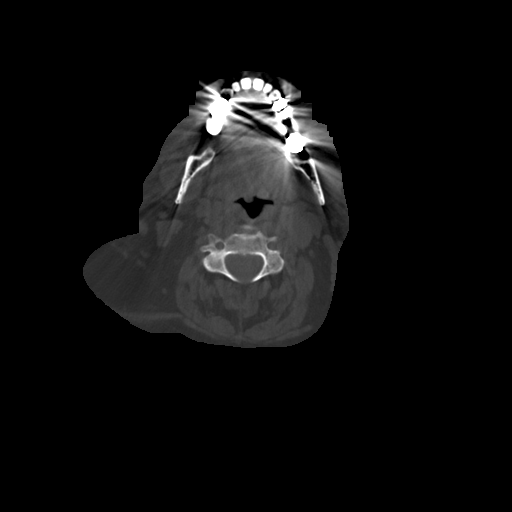

[Series 4: ax bone · axial · 0.61mm/px · z∈[-468,-396]mm · 2 of 110 slices shown]
[im 37/110  bone]
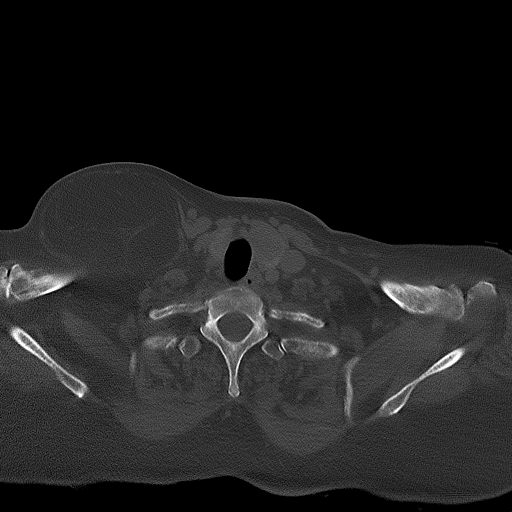
[im 73/110  bone]
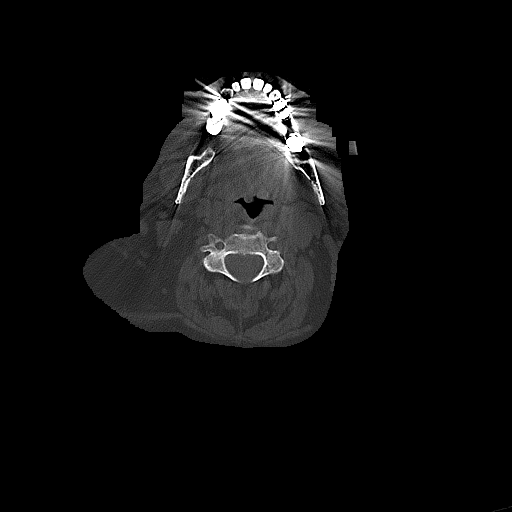

[Series 7: ax oropharynx · axial · 0.63mm/px · z∈[-572,-409]mm · 4 of 156 slices shown, 5 images]
[im 32/156  soft-tissue]
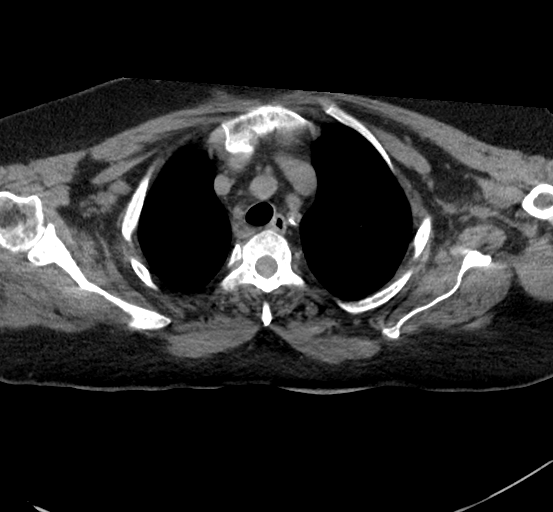
[im 32/156  bone]
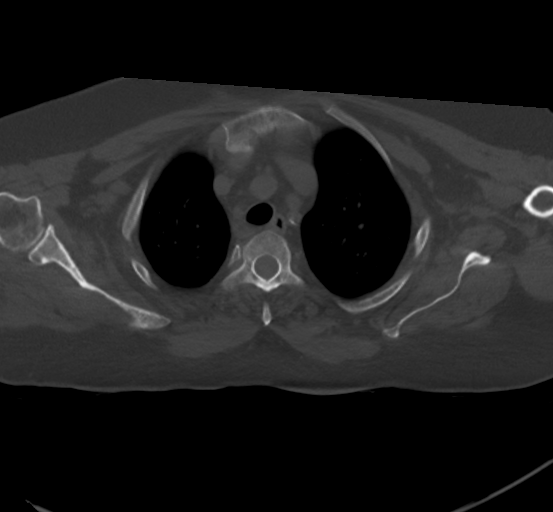
[im 63/156  bone]
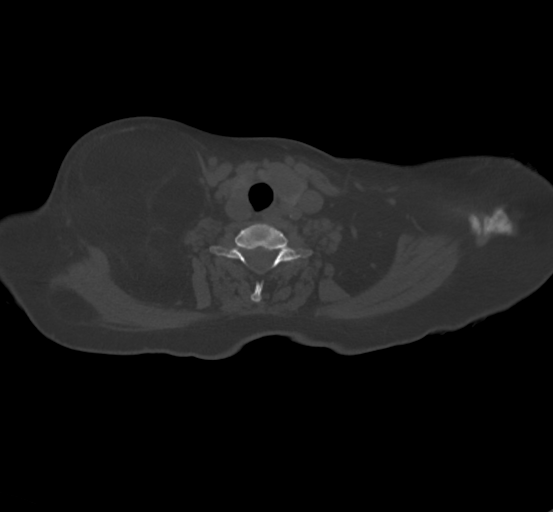
[im 94/156  bone]
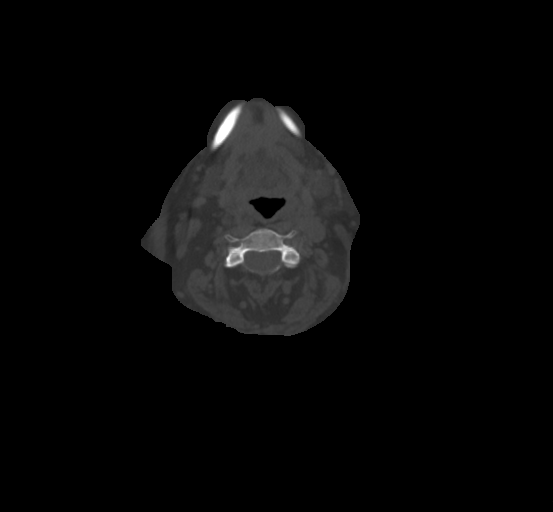
[im 125/156  bone]
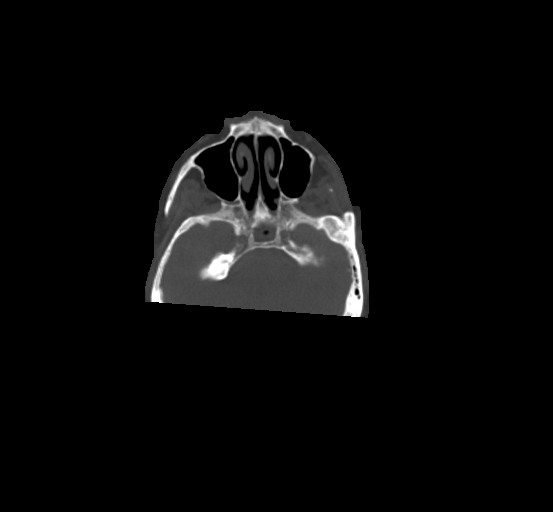

[8 of 14 positions shown; findings below may reference images not displayed]

FINDINGS: Pharynx and larynx: Streak artifact from dental restoration
partially obscures the oral cavity. No appreciable swelling or
discrete mass within the oral cavity, pharynx or larynx.

Salivary glands: There are a few punctate calcific foci within the
left parotid gland which may reflect calcifications or small
sialoliths. No evidence of salivary gland mass or inflammation.

Thyroid: Multiple thyroid nodules, the largest within the mid to
inferior aspect of the left lobe measuring 4.1 cm.

Lymph nodes: No pathologically enlarged cervical chain lymph nodes.

Vascular: Limited assessment of the major vascular structures of the
neck in the absence of intravenous contrast. Calcified
atherosclerotic plaque within the visualized aortic arch, proximal
major branch vessels of the neck and carotid arteries.

Limited intracranial: No evidence of acute intracranial abnormality
within the field of view.

Visualized orbits: No mass or acute finding at the imaged levels.

Mastoids and visualized paranasal sinuses: Mild to moderate mucosal
thickening and small volume frothy secretions within the bilateral
sphenoid sinuses. Tiny right maxillary sinus mucous retention cyst.
No significant mastoid effusion.

Skeleton: No acute bony abnormality or aggressive osseous lesion.
Cervical spondylosis. Most notably at C5-C6, there is
moderate/advanced disc degeneration with a disc bulge, endplate
spurring and uncovertebral hypertrophy. Bilateral bony neural
foraminal narrowing and suspected moderate spinal canal stenosis at
this level. No appreciable high-grade spinal canal stenosis at the
remaining levels. Additional sites of bony neural foraminal
narrowing greatest on the right at C3-C4 and bilaterally at C4-C5.

Upper chest: Separately reported. No consolidation within the imaged
lung apices.

Other: There is a large lobular fat density lesion within the right
mid to lower neck compatible with lipoma. There are several thin
internal septations. The mass extends inferiorly to the level of the
clavicle. The mass measures 12.9 x 9.4 x 10.8 cm.
IMPRESSION: 12.9 x 9.4 x 10.8 cm (AP x TV x CC) fat density lesion compatible
with lipoma within the right mid to lower neck, extending inferiorly
to the level of the clavicle.

Multiple thyroid nodules which have increased in size as compared to
the thyroid ultrasound of 03/13/2011. Most notably, a dominant
nodule within the left mid to inferior thyroid lobe now measures
cm. A thyroid ultrasound is recommended for re-evaluation.

Bilateral sphenoid sinusitis.

Several small calcific foci within the left parotid gland, which may
reflect calcifications or small sialoliths.

Cervical spondylosis as described and greatest at C5-C6.

Aortic Atherosclerosis ([id number].E).

## 2023-06-26 ENCOUNTER — Other Ambulatory Visit: Payer: Self-pay

## 2023-06-26 ENCOUNTER — Emergency Department (HOSPITAL_BASED_OUTPATIENT_CLINIC_OR_DEPARTMENT_OTHER)
Admission: EM | Admit: 2023-06-26 | Discharge: 2023-06-26 | Disposition: A | Discharge status: Home / Self Care | Payer: Medicare Other | Attending: Emergency Medicine | Admitting: Emergency Medicine

## 2023-06-26 DIAGNOSIS — N3000 Acute cystitis without hematuria: Secondary | ICD-10-CM | POA: Diagnosis not present

## 2023-06-26 DIAGNOSIS — R350 Frequency of micturition: Secondary | ICD-10-CM | POA: Diagnosis present

## 2023-06-26 LAB — URINALYSIS, ROUTINE W REFLEX MICROSCOPIC
Bilirubin Urine: NEGATIVE
Glucose, UA: NEGATIVE mg/dL
Hgb urine dipstick: NEGATIVE
Ketones, ur: NEGATIVE mg/dL
Nitrite: NEGATIVE
Protein, ur: NEGATIVE mg/dL
Specific Gravity, Urine: 1.005 (ref 1.005–1.030)
WBC, UA: >50 WBC/hpf (ref 0–5)
pH: 6 (ref 5.0–8.0)

## 2023-06-26 MED ORDER — SULFAMETHOXAZOLE-TRIMETHOPRIM 800-160 MG PO TABS: 1.0000 | ORAL_TABLET | Freq: Two times a day (BID) | ORAL | 0 refills | Status: AC

## 2023-06-26 MED ORDER — SULFAMETHOXAZOLE-TRIMETHOPRIM 800-160 MG PO TABS
1.0000 | ORAL_TABLET | Freq: Once | ORAL | Status: AC
  Administered 2023-06-26: 1 via ORAL
  Filled 2023-06-26: qty 1

## 2023-06-26 NOTE — ED Notes (Signed)

## 2023-06-26 NOTE — ED Notes (Signed)
Pt given specimen cup for urine and asked to provide sample.

## 2023-06-26 NOTE — Discharge Instructions (Signed)
Follow-up with your urologist in the office.  Please return for fever worsening pain inability eat or drink.

## 2023-06-26 NOTE — ED Provider Notes (Signed)
[place] EMERGENCY DEPARTMENT AT [facility] Provider Note   CSN: [id number] Arrival date & time: 06/26/23  1236     History  Chief Complaint  Patient presents with   Urinary Tract Infection    Sarah Fuentes is a 72 y.o. female.  72 yo F with a chief complaints of dysuria increased frequency and hesitancy.  This started this morning.  She has a history of a ruptured left ureter requiring surgery.  This was done in [place].  She has been seen regularly by urology here and has had no issues postop.  She denies fevers.  Had some right flank pain that lasted for a brief moment and resolved.   Urinary Tract Infection      Home Medications Prior to Admission medications   Medication Sig Start Date End Date Taking? Authorizing Provider  sulfamethoxazole-trimethoprim (BACTRIM DS) 800-160 MG tablet Take 1 tablet by mouth 2 (two) times daily for 7 days. 06/26/23 07/03/23 Yes Melene Plan, DO  ALPRAZolam Prudy Feeler) 0.25 MG tablet Take 0.25 mg by mouth 2 (two) times daily as needed for anxiety.     [provider]  BYSTOLIC 10 MG tablet Take 10 mg by mouth in the morning, at noon, and at bedtime. 09/12/13   [provider]  cyclobenzaprine (FLEXERIL) 10 MG tablet Take 10 mg by mouth 3 (three) times daily as needed for muscle spasms.    [provider]  dexlansoprazole (DEXILANT) 60 MG capsule Take 60 mg by mouth daily.    [provider]  diphenhydrAMINE (BENADRYL) 25 MG tablet Take 25 mg by mouth every 6 (six) hours as needed for itching or allergies.    [provider]  EPINEPHrine 0.3 mg/0.3 mL IJ SOAJ injection SMARTSIG:injection IM As Directed 11/22/21   [provider]  furosemide (LASIX) 40 MG tablet Take 40 mg by mouth daily.  09/12/13   [provider]  hydrALAZINE (APRESOLINE) 50 MG tablet Take 50 mg by mouth 3 (three) times daily.    [provider]  ibuprofen (ADVIL,MOTRIN) 200 MG tablet Take 200 mg by  mouth every 6 (six) hours as needed (knee pain).    [provider]  metFORMIN (GLUCOPHAGE) 500 MG tablet Take 500 mg by mouth at bedtime.    [provider]  Pitavastatin Calcium (LIVALO) 2 MG TABS Take by mouth daily.    [provider]  predniSONE (DELTASONE) 50 MG tablet Take 50 mg by mouth. 1 tablet PO 13 Hours Prior to Procedure 1 tablet PO 7 Hours Prior to procedure 1 tablet PO 1 Hour Prior to procedure Patient not taking: Reported on 04/10/2022    Chuck Hint, MD  promethazine (PHENERGAN) 12.5 MG tablet Take 1 tablet (12.5 mg total) by mouth every 6 (six) hours as needed for nausea or vomiting. 11/01/13   Jimmye Norman, MD  Tafluprost, PF, (ZIOPTAN) 0.0015 % SOLN Place 1 drop into both eyes at bedtime.    [provider]  valsartan (DIOVAN) 320 MG tablet Take 320 mg by mouth daily.  09/12/13   [provider]      Allergies    Iodine, Keflex [cephalexin], Penicillins, Raspberry, Strawberry extract, Sulfa antibiotics, Amlodipine, Atenolol, Clonidine hcl, Empagliflozin, Erythromycin, Fluoxetine, Influenza vaccines, Iodinated contrast media, Iohexol, Levaquin [levofloxacin in d5w], Macrobid [nitrofurantoin], Metaproterenol, Metformin hcl, Metronidazole, Naproxen, Other, Ramipril, Rofecoxib, Rosuvastatin, Strawberry (diagnostic), Sulfamethoxazole-trimethoprim, Sulfites, Tape, Tetanus antitoxin, Tetanus toxoid, Tetanus toxoids, Toprol xl [metoprolol tartrate], Welchol [colesevelam], and Silicone    Review of Systems  Review of Systems  Physical Exam Updated Vital Signs BP (!) 170/58   Pulse 88   Temp 97.9 F (36.6 C)   Resp 18   SpO2 96%  Physical Exam Vitals and nursing note reviewed.  Constitutional:      General: She is not in acute distress.    Appearance: She is well-developed. She is not diaphoretic.  HENT:     Head: Normocephalic and atraumatic.  Eyes:     Pupils: Pupils are equal, round, and reactive to light.   Cardiovascular:     Rate and Rhythm: Normal rate and regular rhythm.     Heart sounds: No murmur heard.    No friction rub. No gallop.  Pulmonary:     Effort: Pulmonary effort is normal.     Breath sounds: No wheezing or rales.  Abdominal:     General: There is no distension.     Palpations: Abdomen is soft.     Tenderness: There is no abdominal tenderness. There is no right CVA tenderness or left CVA tenderness.  Musculoskeletal:        General: No tenderness.     Cervical back: Normal range of motion and neck supple.  Skin:    General: Skin is warm and dry.  Neurological:     Mental Status: She is alert and oriented to person, place, and time.  Psychiatric:        Behavior: Behavior normal.     ED Results / Procedures / Treatments   Labs (all labs ordered are listed, but only abnormal results are displayed) Labs Reviewed  URINALYSIS, ROUTINE W REFLEX MICROSCOPIC - Abnormal; Notable for the following components:      Result Value   APPearance HAZY (*)    Leukocytes,Ua LARGE (*)    Bacteria, UA FEW (*)    All other components within normal limits    EKG None  Radiology No results found.  Procedures Procedures    Medications Ordered in ED Medications  sulfamethoxazole-trimethoprim (BACTRIM DS) 800-160 MG per tablet 1 tablet (has no administration in time range)    ED Course/ Medical Decision Making/ A&P                                 Medical Decision Making Amount and/or Complexity of Data Reviewed Labs: ordered.  Risk Prescription drug management.   72 yo F with a chief complaints of dysuria increased frequency and hesitancy.  This started this morning.  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  This was done in [place].  She tells me she has been seen regularly since she moved here.  Has not had any urinary tract  infections recently.  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  Rare bacteria.  Will start on antibiotics.  Urology follow-up.  2:58 PM:  I have discussed the diagnosis/risks/treatment options with the patient.  Evaluation and diagnostic testing in the emergency department does not suggest an emergent condition requiring admission or immediate intervention beyond what has been performed at this time.  They will follow up with Urology. We also discussed returning to the ED immediately if new or worsening sx occur. We discussed the sx which are most concerning (e.g., sudden worsening pain, fever, inability to tolerate by mouth) that necessitate immediate return. Medications administered to the patient during  their visit and any new prescriptions provided to the patient are listed below.  Medications given during this visit Medications  sulfamethoxazole-trimethoprim (BACTRIM DS) 800-160 MG per tablet 1 tablet (has no administration in time range)     The patient appears reasonably screen and/or stabilized for discharge and I doubt any other medical condition or other [facility] requiring further screening, evaluation, or treatment in the ED at this time prior to discharge.          Final Clinical Impression(s) / ED Diagnoses Final diagnoses:  Acute cystitis without hematuria    Rx / DC Orders ED Discharge Orders          Ordered    sulfamethoxazole-trimethoprim (BACTRIM DS) 800-160 MG tablet  2 times daily        06/26/23 1457              Melene Plan, DO 06/26/23 1458

## 2023-06-26 NOTE — ED Triage Notes (Signed)
Pt awoke this morning with dysuria and frequency, mild right flank pain and mild nausea.  Denies any fever or abdominal pain.   NAD, AAOx4 in triage.

## 2023-07-13 ENCOUNTER — Telehealth: Payer: Self-pay

## 2023-07-13 NOTE — Progress Notes (Signed)
Transition Care Management Follow-up Telephone Call Date of discharge and from where: 06/26/2023 [facility] How have you been since you were released from the hospital? Patient stated she is feeling much better. Any questions or concerns? No  Items Reviewed: Did the pt receive and understand the discharge instructions provided? Yes  Medications obtained and verified? Yes  Other? No  Any new allergies since your discharge? No  Dietary orders reviewed? Yes Do you have support at home? Yes   Follow up appointments reviewed:  PCP Hospital f/u appt confirmed?  Patient stated she has followe up with her PCP  Scheduled to see  on  @ . Specialist Hospital f/u appt confirmed? No  Scheduled to see  on  @ . Are transportation arrangements needed? No  If their condition worsens, is the pt aware to call PCP or go to the Emergency Dept.? Yes Was the patient provided with contact information for the PCP's office or ED? Yes Was to pt encouraged to call back with questions or concerns? Yes   [facility]  [facility], [facility] Guide Direct Dial: [phone]  Website: [email or web address]
